# Patient Record
Sex: Male | Born: 1935 | Marital: Married | Smoking: Former smoker
Health system: Southern US, Community
[De-identification: ages and names within clinical notes are randomized; demographics above are authoritative.]

## PROBLEM LIST (undated history)

## (undated) DIAGNOSIS — F319 Bipolar disorder, unspecified: Secondary | ICD-10-CM

## (undated) DIAGNOSIS — E039 Hypothyroidism, unspecified: Secondary | ICD-10-CM

## (undated) DIAGNOSIS — F0390 Unspecified dementia without behavioral disturbance: Secondary | ICD-10-CM

## (undated) DIAGNOSIS — H409 Unspecified glaucoma: Secondary | ICD-10-CM

## (undated) DIAGNOSIS — N400 Enlarged prostate without lower urinary tract symptoms: Secondary | ICD-10-CM

## (undated) DIAGNOSIS — L819 Disorder of pigmentation, unspecified: Secondary | ICD-10-CM

## (undated) DIAGNOSIS — M25561 Pain in right knee: Secondary | ICD-10-CM

## (undated) DIAGNOSIS — M25562 Pain in left knee: Secondary | ICD-10-CM

## (undated) DIAGNOSIS — K219 Gastro-esophageal reflux disease without esophagitis: Secondary | ICD-10-CM

## (undated) DIAGNOSIS — I10 Essential (primary) hypertension: Secondary | ICD-10-CM

## (undated) HISTORY — DX: Essential (primary) hypertension: I10

## (undated) HISTORY — DX: Bipolar disorder, unspecified: F31.9

## (undated) HISTORY — PX: OTHER SURGICAL HISTORY: SHX169

## (undated) HISTORY — DX: Pain in right knee: M25.561

## (undated) HISTORY — DX: Pain in left knee: M25.562

## (undated) HISTORY — DX: Unspecified glaucoma: H40.9

## (undated) HISTORY — DX: Disorder of pigmentation, unspecified: L81.9

## (undated) HISTORY — DX: Gastro-esophageal reflux disease without esophagitis: K21.9

## (undated) HISTORY — DX: Hypothyroidism, unspecified: E03.9

## (undated) HISTORY — DX: Benign prostatic hyperplasia without lower urinary tract symptoms: N40.0

---

## 2000-11-29 ENCOUNTER — Encounter: Admission: RE | Admit: 2000-11-29 | Discharge: 2000-11-29 | Payer: Self-pay | Admitting: Emergency Medicine

## 2000-11-29 ENCOUNTER — Encounter: Payer: Self-pay | Admitting: Emergency Medicine

## 2006-11-28 ENCOUNTER — Ambulatory Visit (HOSPITAL_COMMUNITY): Admission: RE | Admit: 2006-11-28 | Discharge: 2006-11-28 | Payer: Self-pay | Admitting: Psychiatry

## 2007-04-20 ENCOUNTER — Encounter: Admission: RE | Admit: 2007-04-20 | Discharge: 2007-04-20 | Payer: Self-pay | Admitting: Emergency Medicine

## 2007-05-31 ENCOUNTER — Encounter (INDEPENDENT_AMBULATORY_CARE_PROVIDER_SITE_OTHER): Payer: Self-pay | Admitting: Gastroenterology

## 2007-05-31 ENCOUNTER — Ambulatory Visit (HOSPITAL_COMMUNITY): Admission: RE | Admit: 2007-05-31 | Discharge: 2007-05-31 | Payer: Self-pay | Admitting: Gastroenterology

## 2011-03-02 NOTE — Op Note (Signed)
William Dougherty, William Dougherty             ACCOUNT NO.:  [id number]   MEDICAL RECORD NO.:  [id number]          PATIENT TYPE:  AMB   LOCATION:  ENDO                         FACILITY:  [facility]   PHYSICIAN:  Anselmo Rod, M.D.  DATE OF BIRTH:  [birth year]   DATE OF PROCEDURE:  05/31/2007  DATE OF DISCHARGE:                               OPERATIVE REPORT   PROCEDURE PERFORMED:  Esophagogastroduodenoscopy with multiple gastric  biopsies.   ENDOSCOPIST:  Anselmo Rod, M.D.   INSTRUMENT USED:  Pentax video panendoscope.   INDICATIONS FOR PROCEDURE:  77 year old [place] male with a history of  abnormal weight loss, decreased appetite and undergoing an EGD to rule  out peptic ulcer disease, masses, polyps, etc.   PREPROCEDURE PREPARATION:  Informed consent was procured from the  patient.  The patient fasted for 8 hours prior to procedure.  Risks and  benefits of the procedure were discussed with the patient in great  detail.   PREPROCEDURE PHYSICAL:  The patient had stable vital signs.  Neck  supple.  Chest clear to auscultation.  S1, S2 regular.  Abdomen soft  with normal bowel sounds.   DESCRIPTION OF PROCEDURE:  The patient was placed in the left lateral  decubitus position and sedated with 50 mcg of fentanyl and 3 mg of  Versed given intravenously in slow incremental doses. Once the patient  was adequately sedated and maintained on low-flow oxygen and continuous  cardiac monitoring, the Pentax video panendoscope was advanced through  the mouthpiece over the tongue and into the esophagus under direct  vision.  The entire esophagus was widely patent with no evidence of  ring, stricture, mass, esophagitis or Barrett's mucosa. The scope was  then advanced into the stomach. There was a 3-4 cm hiatal hernia  appreciated on high retroflexion. Patchy gastritis was noted especially  in the mid body and antrum. Biopsies were done to rule out presence of H  pylori by pathology.  The proximal small  bowel appeared normal. There  was no outlet obstruction.  The patient tolerated the procedure well  without immediate complications.   IMPRESSION:  1. Normal-appearing esophagus with a healthy Z line.  2. 2-4 cm hiatal hernia seen on high retroflexion.  3. Patchy gastritis in mid body and antrum with old heme overlying      these areas, biopsies done for H pylori.  4. Normal proximal small bowel with no evidence of outlet obstruction.   RECOMMENDATIONS:  1. Await pathology results.  2. Avoid all nonsteroidals including aspirin for the next 4 weeks.  3. Proceed with a colonoscopy at this time.  4. Further recommendations to be made in follow-up.      Anselmo Rod, M.D.  Electronically Signed     JNM/MEDQ  D:  05/31/2007  T:  06/01/2007  Job:  [id number]   cc:   Reuben Likes, M.D.  Fax: [phone]

## 2011-03-02 NOTE — Op Note (Signed)
William Dougherty, William Dougherty             ACCOUNT NO.:  [id number]   MEDICAL RECORD NO.:  [id number]          PATIENT TYPE:  AMB   LOCATION:  ENDO                         FACILITY:  [facility]   PHYSICIAN:  Anselmo Rod, M.D.  DATE OF BIRTH:  1926-04-11   DATE OF PROCEDURE:  05/31/2007  DATE OF DISCHARGE:                               OPERATIVE REPORT   PROCEDURE PERFORMED:  Colonoscopy with cold biopsies x6 and snare  polypectomy x 1.   ENDOSCOPIST:  Anselmo Rod, M.D.   INSTRUMENT USED:  Pentax video colonoscope.   INDICATIONS FOR PROCEDURE:  77 year old [place] male undergoing  colonoscopy for colorectal cancer screening purposes. The patient had a  history of weight loss in the recent past with decreased appetite rule  out colonic polyps, masses, etc.   PREPROCEDURE PREPARATION:  Informed consent was procured from the  patient. The patient fasted for 8 hours prior to the procedure and  prepped with a bottle of magnesium citrate and gallon of NuLytely the  night prior to procedure.  Risks and benefits of the procedure including  a 10% miss rate of cancer and polyp were discussed with the patient as  well.   PREPROCEDURE PHYSICAL:  The patient had stable vital signs. Neck supple.  Chest clear to auscultation. S1, S2 regular. Abdomen soft with normal  bowel sounds.   DESCRIPTION OF PROCEDURE:  The patient was placed in left lateral  decubitus position and sedated with an additional 10 mcg of Fentanyl and  2 mg of Versed given intravenously in slow incremental doses. Once the  patient was adequately sedated and maintained on low-flow oxygen and  continuous cardiac monitoring the Pentax video colonoscope was advanced  from the rectum to the cecum.  Four polyps were biopsied from the distal  left colon.  Small sessile polyp was removed by cold snare from the  rectum.  The transverse colon, right colon, cecum and terminal ileum  appeared normal.  The appendiceal orifice and ileocecal  valve were  clearly visualized and photographed.  Retroflexion in the rectum  revealed no abnormalities. The patient tolerated the procedure well  without immediate complications.  There was no evidence of  diverticulosis.   IMPRESSION:  1. Four small sessile polyps biopsied from the left colon.  2. Another small sessile polyp removed by cold snare from the rectum.  3. Normal-appearing transverse colon, right colon, cecum and terminal      ileum.  4. No evidence of diverticulosis.   RECOMMENDATIONS:  1. Await pathology results.  2. Avoid all nonsteroidals including aspirin for the next 4 weeks.  3. Outpatient follow-up in the next 2 weeks for further      recommendations.      Anselmo Rod, M.D.  Electronically Signed     JNM/MEDQ  D:  05/31/2007  T:  06/01/2007  Job:  [id number]   cc:   Reuben Likes, M.D.  Fax: [phone]

## 2012-07-05 ENCOUNTER — Other Ambulatory Visit: Payer: Self-pay | Admitting: Family Medicine

## 2012-07-05 DIAGNOSIS — E041 Nontoxic single thyroid nodule: Secondary | ICD-10-CM

## 2012-07-07 ENCOUNTER — Ambulatory Visit
Admission: RE | Admit: 2012-07-07 | Discharge: 2012-07-07 | Disposition: A | Discharge status: Home / Self Care | Payer: Medicare Other | Source: Ambulatory Visit | Attending: Family Medicine | Admitting: Family Medicine

## 2012-07-07 DIAGNOSIS — E041 Nontoxic single thyroid nodule: Secondary | ICD-10-CM

## 2012-07-25 ENCOUNTER — Other Ambulatory Visit: Payer: Self-pay | Admitting: Endocrinology

## 2012-07-25 DIAGNOSIS — E041 Nontoxic single thyroid nodule: Secondary | ICD-10-CM

## 2012-07-26 ENCOUNTER — Other Ambulatory Visit: Payer: Self-pay | Admitting: Radiology

## 2012-08-03 ENCOUNTER — Ambulatory Visit
Admission: RE | Admit: 2012-08-03 | Discharge: 2012-08-03 | Disposition: A | Discharge status: Home / Self Care | Payer: Medicare Other | Source: Ambulatory Visit | Attending: Endocrinology | Admitting: Endocrinology

## 2012-08-03 ENCOUNTER — Other Ambulatory Visit (HOSPITAL_COMMUNITY)
Admission: RE | Admit: 2012-08-03 | Discharge: 2012-08-03 | Disposition: A | Discharge status: Home / Self Care | Payer: Medicare Other | Source: Ambulatory Visit | Attending: Interventional Radiology | Admitting: Interventional Radiology

## 2012-08-03 DIAGNOSIS — E041 Nontoxic single thyroid nodule: Secondary | ICD-10-CM

## 2012-08-03 DIAGNOSIS — E049 Nontoxic goiter, unspecified: Secondary | ICD-10-CM | POA: Insufficient documentation

## 2013-08-10 ENCOUNTER — Encounter: Payer: Self-pay | Admitting: Neurology

## 2013-08-13 ENCOUNTER — Ambulatory Visit
Admission: RE | Admit: 2013-08-13 | Discharge: 2013-08-13 | Disposition: A | Discharge status: Home / Self Care | Payer: Medicare Other | Source: Ambulatory Visit | Attending: Neurology | Admitting: Neurology

## 2013-08-13 ENCOUNTER — Telehealth: Payer: Self-pay | Admitting: Neurology

## 2013-08-13 ENCOUNTER — Encounter: Payer: Self-pay | Admitting: Neurology

## 2013-08-13 ENCOUNTER — Ambulatory Visit (INDEPENDENT_AMBULATORY_CARE_PROVIDER_SITE_OTHER): Payer: Medicare Other | Admitting: Neurology

## 2013-08-13 VITALS — BP 111/64 | HR 72 | Ht 65.0 in | Wt 135.0 lb

## 2013-08-13 DIAGNOSIS — R32 Unspecified urinary incontinence: Secondary | ICD-10-CM

## 2013-08-13 DIAGNOSIS — R413 Other amnesia: Secondary | ICD-10-CM | POA: Insufficient documentation

## 2013-08-13 DIAGNOSIS — M25569 Pain in unspecified knee: Secondary | ICD-10-CM

## 2013-08-13 DIAGNOSIS — R269 Unspecified abnormalities of gait and mobility: Secondary | ICD-10-CM

## 2013-08-13 DIAGNOSIS — E538 Deficiency of other specified B group vitamins: Secondary | ICD-10-CM | POA: Insufficient documentation

## 2013-08-13 NOTE — Progress Notes (Signed)
[facility]  PATIENT: William Dougherty DOB: 03/20/1926  HISTORICAL  Mr. Celaya is a 79 years old [place] male, is accompanied by his son, daughter-in-law, referred by his primary care physician Dr. Elias Else for evaluation of gradual onset memory trouble.  He is a retired Art gallery manager, has past medical history of hypothyroidism, on supplement, hypertension. He lives with his son's family, over the past 2 years, he had gradual onset memory loss, he was noted to repeat himself, become very forgetful, one hour after eating breakfast, he would forget that he has eaten breakfast, he spent most of his day watching TV, not interested in any activities,  He has gait trouble, walking with a cane, he complains of bilateral knee pain, denies significant low back pain, over the past 6 months, he has worsening memory trouble, also developed urinary incontinence, He wet himself himself on his way to the bathroom. He denies bowel incontinence  He can dress himself.   REVIEW OF SYSTEMS: Full 14 system review of systems performed and notable only for  confusion, weakness, sleepiness, too much sleep decreased energy, disinterested in activities, urination problems  ALLERGIES: Allergies  Allergen Reactions  . Eggs Or Egg-Derived Products Diarrhea and Nausea Only    HOME MEDICATIONS: Outpatient Prescriptions Prior to Visit  Medication Sig Dispense Refill  . cholecalciferol (VITAMIN D) 1000 UNITS tablet Take 2,000 Units by mouth daily.      Marland Kitchen dutasteride (AVODART) 0.5 MG capsule Take 0.5 mg by mouth daily.      . influenza virus trivalent vaccine (FLUZONE MDV) injection Inject 0.5 mLs into the muscle once. Per records received flu shot on 06/05/2013.      . levothyroxine (SYNTHROID, LEVOTHROID) 50 MCG tablet Take 50 mcg by mouth daily before breakfast.      . losartan-hydrochlorothiazide (HYZAAR) 50-12.5 MG per tablet Take 1 tablet by mouth every morning.      . travoprost,  benzalkonium, (TRAVATAN) 0.004 % ophthalmic solution 1 drop at bedtime.       No facility-administered medications prior to visit.    PAST MEDICAL HISTORY: Past Medical History  Diagnosis Date  . Hypertension   . Hypothyroidism   . BPH (benign prostatic hyperplasia)   . Glaucoma   . GERD (gastroesophageal reflux disease)   . Knee pain, bilateral   . Bipolar 1 disorder   . Hyperpigmentation     left maxilla    PAST SURGICAL HISTORY: Past Surgical History  Procedure Laterality Date  . None      FAMILY HISTORY: Family History  Problem Relation Age of Onset  . Hypothyroidism Son   . Vitiligo Son   . Hashimoto's thyroiditis Grandchild     SOCIAL HISTORY:    PHYSICAL EXAM   Filed Vitals:   08/13/13 0945  BP: 111/64  Pulse: 72  Height: 5\' 5"  (1.651 m)  Weight: 135 lb (61.236 kg)   Body mass index is 22.47 kg/(m^2).   Generalized: In no acute distress  Neck: Supple, no carotid bruits   Cardiac: Regular rate rhythm  Pulmonary: Clear to auscultation bilaterally  Musculoskeletal: No deformity  Neurological examination  Mentation: Alert oriented to time, place, history taking, and causual conversation, MMSE 22/30, he is not oriented to time, missed 3/3 recalls.  Cranial nerve II-XII: Pupils were equal round reactive to light extraocular movements were full, visual field were full on confrontational test. facial sensation and strength were normal. hearing was intact to finger rubbing bilaterally. Uvula tongue midline.  head turning and  shoulder shrug and were normal and symmetric.Tongue protrusion into cheek strength was normal.  Motor: normal tone, bulk and strength.  Sensory: Intact to fine touch, pinprick, preserved vibratory sensation, and proprioception at toes.  Coordination: Normal finger to nose, heel-to-shin bilaterally there was no truncal ataxia  Gait: push up from seated position, cautious, mildly limp, difficulty swing his right pelvic  Deep  tendon reflexes: Brachioradialis 2/2, biceps 2/2, triceps 2/2, patellar 2/2, Achilles 1/1, plantar responses were flexor bilaterally.   DIAGNOSTIC DATA (LABS, IMAGING, TESTING) - I reviewed patient records, labs, notes, testing and imaging myself where available.  ASSESSMENT AND PLAN   79 year old [place] male, with gradual onset memory trouble, most consistent with mild dementia, Mini-Mental Status Examination is 22 out of 30 today,  1. proceed with MRI of brain 2. Laboratory evaluation to rule out treatable cause, including vitamin B12, 3. x-ray of his hip, and knee, 4. Return to clinic in 3 months       Levert Feinstein, M.D. Ph.D.  [facility] [address], [address] [place], [place] [address] [phone]

## 2013-08-13 NOTE — Telephone Encounter (Signed)
Called, to give results, left message  With daughter in law for return call back for results

## 2013-08-13 NOTE — Telephone Encounter (Signed)
Please call patient, x-ray of hips were normal.  X-ray of knee showed mild effusion.

## 2013-08-13 NOTE — Telephone Encounter (Signed)
Shared x-ray results per Dr Zannie Cove notes with son ,he understood

## 2013-08-14 LAB — C-REACTIVE PROTEIN: CRP: 0.1 mg/L (ref 0.0–4.9)

## 2013-08-14 LAB — SEDIMENTATION RATE: Sed Rate: 3 mm/hr (ref 0–30)

## 2013-08-16 ENCOUNTER — Ambulatory Visit: Payer: Medicare Other | Attending: Neurology | Admitting: Physical Therapy

## 2013-08-16 DIAGNOSIS — M6281 Muscle weakness (generalized): Secondary | ICD-10-CM | POA: Insufficient documentation

## 2013-08-16 DIAGNOSIS — IMO0001 Reserved for inherently not codable concepts without codable children: Secondary | ICD-10-CM | POA: Insufficient documentation

## 2013-08-16 DIAGNOSIS — R269 Unspecified abnormalities of gait and mobility: Secondary | ICD-10-CM | POA: Insufficient documentation

## 2013-08-16 NOTE — Progress Notes (Signed)
Quick Note:  Shared normal labs thru VM message ______ 

## 2013-08-17 LAB — VITAMIN B12: Vitamin B-12: 325 pg/mL (ref 211–946)

## 2013-08-17 LAB — METHYLMALONIC ACID, SERUM: Methylmalonic Acid: 213 nmol/L (ref 0–378)

## 2013-08-17 NOTE — Progress Notes (Signed)
Quick Note:  Shared normal lab results thru VM message ______ 

## 2013-08-17 NOTE — Progress Notes (Signed)
Quick Note:  Please call patient for normal lab. ______

## 2013-08-20 ENCOUNTER — Ambulatory Visit
Admission: RE | Admit: 2013-08-20 | Discharge: 2013-08-20 | Disposition: A | Discharge status: Home / Self Care | Payer: Medicare Other | Source: Ambulatory Visit | Attending: Neurology | Admitting: Neurology

## 2013-08-20 DIAGNOSIS — R269 Unspecified abnormalities of gait and mobility: Secondary | ICD-10-CM

## 2013-08-20 DIAGNOSIS — R413 Other amnesia: Secondary | ICD-10-CM

## 2013-08-20 DIAGNOSIS — R32 Unspecified urinary incontinence: Secondary | ICD-10-CM

## 2013-08-20 DIAGNOSIS — M25569 Pain in unspecified knee: Secondary | ICD-10-CM

## 2013-08-21 ENCOUNTER — Telehealth: Payer: Self-pay | Admitting: Neurology

## 2013-08-21 ENCOUNTER — Ambulatory Visit: Payer: Medicare Other | Attending: Neurology | Admitting: Physical Therapy

## 2013-08-21 DIAGNOSIS — M6281 Muscle weakness (generalized): Secondary | ICD-10-CM | POA: Insufficient documentation

## 2013-08-21 DIAGNOSIS — IMO0001 Reserved for inherently not codable concepts without codable children: Secondary | ICD-10-CM | POA: Insufficient documentation

## 2013-08-21 DIAGNOSIS — R269 Unspecified abnormalities of gait and mobility: Secondary | ICD-10-CM | POA: Insufficient documentation

## 2013-08-21 NOTE — Progress Notes (Signed)
Quick Note:  Please call patient, age related atrophy on brain MRI, we can review film together on his follow up visit. ______

## 2013-08-23 ENCOUNTER — Other Ambulatory Visit: Payer: Self-pay

## 2013-08-23 NOTE — Telephone Encounter (Signed)
Please give him a follow up visit with me on next available.

## 2013-08-23 NOTE — Progress Notes (Signed)
Quick Note:  Please call patient's son at [phone], he has lots of questions and wants to speak to doctor today. I relayed MRI brain results, per Dr. Terrace Arabia. ______

## 2013-08-23 NOTE — Telephone Encounter (Signed)
Spoke to patients son and the family will come see you 08-28-2013 Tuesday.

## 2013-08-24 ENCOUNTER — Ambulatory Visit: Payer: Medicare Other | Admitting: Physical Therapy

## 2013-08-28 ENCOUNTER — Encounter: Payer: Self-pay | Admitting: Neurology

## 2013-08-28 ENCOUNTER — Ambulatory Visit (INDEPENDENT_AMBULATORY_CARE_PROVIDER_SITE_OTHER): Payer: Medicare Other | Admitting: Neurology

## 2013-08-28 ENCOUNTER — Ambulatory Visit: Payer: Medicare Other | Admitting: Physical Therapy

## 2013-08-28 VITALS — BP 120/66 | HR 65 | Ht 65.0 in | Wt 134.0 lb

## 2013-08-28 DIAGNOSIS — F0390 Unspecified dementia without behavioral disturbance: Secondary | ICD-10-CM | POA: Insufficient documentation

## 2013-08-28 MED ORDER — DONEPEZIL HCL 10 MG PO TABS: 10.0000 mg | ORAL_TABLET | Freq: Every day | ORAL | Status: DC

## 2013-08-28 NOTE — Progress Notes (Signed)
[facility]  PATIENT: William Dougherty DOB: Oct 11, 1926  HISTORICAL  William Dougherty is a 79 years old [place] male, is accompanied by his son, daughter-in-law, referred by his primary care physician Dr. Elias Else for evaluation of gradual onset memory trouble.  He is a retired Art gallery manager, has past medical history of hypothyroidism, on supplement, hypertension. He lives with his son's family, over the past 2 years, he had gradual onset memory loss, he was noted to repeat himself, become very forgetful, one hour after eating breakfast, he would forget that he has eaten breakfast, he spent most of his day watching TV, not interested in any activities,  He has gait trouble, walking with a cane, he complains of bilateral knee pain, denies significant low back pain, over the past 6 months, he has worsening memory trouble, also developed urinary incontinence, He wet himself himself on his way to the bathroom. He denies bowel incontinence  In 2008, he had excessive activity, calling people at night, shopping mood, he was treated with seroqueal, he became sleepy all the time,  He can dress himself.  UPDATE 09/03/2013: He is accompanied by his son and daughter in law today,  He denies significant pain, MRI of the brain reviewed together, there is evidence of moderate to severe atrophy, mild small vessel disease, laboratory showed a normal ESR, C-reactive protein, vitamin B12, methylmalonic acid level.  X-ray of bilateral hip showed no significant abnormality, x-ray of bilateral knee showed degenerative arthritis, with evidence of effusion.   He continued to have gait difficulty.  he denies low back pain, he needs prompt to take a bath, using bathroom, otherwise he would make accident,   REVIEW OF SYSTEMS: Full 14 system review of systems performed and notable only for  confusion, weakness, sleepiness, too much sleep decreased energy, disinterested in activities, urination  problems  ALLERGIES: Allergies  Allergen Reactions  . Eggs Or Egg-Derived Products Diarrhea and Nausea Only    HOME MEDICATIONS: Outpatient Prescriptions Prior to Visit  Medication Sig Dispense Refill  . cholecalciferol (VITAMIN D) 1000 UNITS tablet Take 2,000 Units by mouth daily.      . dorzolamide (TRUSOPT) 2 % ophthalmic solution       . dutasteride (AVODART) 0.5 MG capsule Take 0.5 mg by mouth daily.      . influenza virus trivalent vaccine (FLUZONE MDV) injection Inject 0.5 mLs into the muscle once. Per records received flu shot on 06/05/2013.      . levothyroxine (SYNTHROID, LEVOTHROID) 50 MCG tablet Take 50 mcg by mouth daily before breakfast.      . losartan-hydrochlorothiazide (HYZAAR) 50-12.5 MG per tablet Take 1 tablet by mouth every morning.      . travoprost, benzalkonium, (TRAVATAN) 0.004 % ophthalmic solution 1 drop at bedtime.       No facility-administered medications prior to visit.    PAST MEDICAL HISTORY: Past Medical History  Diagnosis Date  . Hypertension   . Hypothyroidism   . BPH (benign prostatic hyperplasia)   . Glaucoma   . GERD (gastroesophageal reflux disease)   . Knee pain, bilateral   . Bipolar 1 disorder   . Hyperpigmentation     left maxilla    PAST SURGICAL HISTORY: Past Surgical History  Procedure Laterality Date  . None      FAMILY HISTORY: Family History  Problem Relation Age of Onset  . Hypothyroidism Son   . Vitiligo Son   . Hashimoto's thyroiditis Grandchild     SOCIAL HISTORY:  PHYSICAL EXAM   Filed Vitals:   08/28/13 0841  BP: 120/66  Pulse: 65  Height: 5\' 5"  (1.651 m)  Weight: 134 lb (60.782 kg)   Body mass index is 22.3 kg/(m^2).   Generalized: In no acute distress  Neck: Supple, no carotid bruits   Cardiac: Regular rate rhythm  Pulmonary: Clear to auscultation bilaterally  Musculoskeletal: No deformity  Neurological examination  Mentation: Alert oriented to time, place, history taking, and  causual conversation, MMSE 18/30, he is not oriented to time, missed 3/3 recalls, he has difficulty copy design   Cranial nerve II-XII: Pupils were equal round reactive to light extraocular movements were full, visual field were full on confrontational test. facial sensation and strength were normal. hearing was intact to finger rubbing bilaterally. Uvula tongue midline.  head turning and shoulder shrug and were normal and symmetric.Tongue protrusion into cheek strength was normal.  Motor: normal tone, bulk and strength.  Sensory: Intact to fine touch, pinprick, preserved vibratory sensation, and proprioception at toes.  Coordination: Normal finger to nose, heel-to-shin bilaterally there was no truncal ataxia  Gait: push up from seated position, cautious, mildly limp, difficulty swing his right pelvic  Deep tendon reflexes: Brachioradialis 2/2, biceps 2/2, triceps 2/2, patellar 2/2, Achilles 1/1, plantar responses were flexor bilaterally.   DIAGNOSTIC DATA (LABS, IMAGING, TESTING) - I reviewed patient records, labs, notes, testing and imaging myself where available.  ASSESSMENT AND PLAN   79 year old [place] male, with gradual onset memory trouble, most consistent with Alzheimer's  dementia, Mini-Mental Status Examination is  18  out of 30 today,  1.  continue moderate exercise, 2 add on Aricept 10 mg every day 3. Return to clinic in 6 months.   Marland Kitchen

## 2013-08-29 ENCOUNTER — Ambulatory Visit: Payer: Medicare Other | Admitting: Physical Therapy

## 2013-08-31 ENCOUNTER — Ambulatory Visit: Payer: Medicare Other | Admitting: Physical Therapy

## 2013-09-04 ENCOUNTER — Ambulatory Visit: Payer: Medicare Other | Admitting: Physical Therapy

## 2013-09-05 ENCOUNTER — Encounter: Payer: Self-pay | Admitting: Podiatry

## 2013-09-05 ENCOUNTER — Ambulatory Visit (INDEPENDENT_AMBULATORY_CARE_PROVIDER_SITE_OTHER): Payer: Medicare Other | Admitting: Podiatry

## 2013-09-05 VITALS — BP 133/67 | HR 70 | Resp 16 | Ht 65.0 in | Wt 130.0 lb

## 2013-09-05 DIAGNOSIS — B351 Tinea unguium: Secondary | ICD-10-CM

## 2013-09-05 DIAGNOSIS — M79609 Pain in unspecified limb: Secondary | ICD-10-CM

## 2013-09-05 NOTE — Progress Notes (Signed)
Patient ID: William Dougherty, male   DOB: 1926/04/08, 79 y.o.   MRN: [id number]  Subjective: 79 year old confused [place] male presents with son requesting debridement of painful mycotic toenails. He has been a patient in the [facility] 2010 and presents intermittently for nail debridement. Last visit was 08/04/2013.  Objective : Hypertrophic, incurvated, discolored toenails x10 with palpable tenderness in all nail plates.  Assessment: Symptomatic onychomycoses x10  Plan: All 10 toenails debrided without any bleeding. Return intervals recommended in 3 months.

## 2013-09-07 ENCOUNTER — Ambulatory Visit: Payer: Medicare Other | Admitting: Physical Therapy

## 2013-09-10 ENCOUNTER — Ambulatory Visit: Payer: Medicare Other | Admitting: Physical Therapy

## 2013-09-12 ENCOUNTER — Ambulatory Visit: Payer: Medicare Other | Admitting: Physical Therapy

## 2013-09-20 ENCOUNTER — Telehealth: Payer: Self-pay | Admitting: *Deleted

## 2013-09-20 NOTE — Telephone Encounter (Signed)
Pt caregiver states pt has a discoloration around the toenail.  I left a message instructing to make an appt, but could begin Epsom salt soaks !/4 C Epsom salt in a Qt warm water soak 20 minutes daily and cover with an antibiotic bandaid until appt.

## 2013-09-21 NOTE — Telephone Encounter (Signed)
LMOVM TO CALL BACK AND SCHED AN APPT

## 2013-09-24 ENCOUNTER — Ambulatory Visit (INDEPENDENT_AMBULATORY_CARE_PROVIDER_SITE_OTHER): Payer: Medicare Other | Admitting: Podiatry

## 2013-09-24 ENCOUNTER — Encounter: Payer: Self-pay | Admitting: Podiatry

## 2013-09-24 VITALS — BP 114/60 | HR 72 | Resp 16 | Ht 66.0 in | Wt 140.0 lb

## 2013-09-24 DIAGNOSIS — L259 Unspecified contact dermatitis, unspecified cause: Secondary | ICD-10-CM

## 2013-09-24 NOTE — Patient Instructions (Addendum)
Apply topical antibiotic ointment to the crack on the skin on the right great toe daily, cover with Band-Aid until healed.

## 2013-09-24 NOTE — Progress Notes (Signed)
   Subjective:    Patient ID: Jojuan Champney, male    DOB: 05/19/1926, 79 y.o.   MRN: [id number]  "He has a problem with the skin around his nail," stated patient's son.  Toe Pain  Incident onset: 1 week, fissured  There was no injury mechanism. Pain location: right hallux. The pain is at a severity of 0/10. The patient is experiencing no pain. He reports no foreign bodies present. Nothing aggravates the symptoms. He has tried nothing for the symptoms.   Patient presents with his son   Review of Systems     Objective:   Physical Exam  79 year old white male who appears confused presents with his son.  Vascular: DP and PT pulses are two over four bilaterally  Neurological: Sensation intact  Dermatological: Fissured distal right hallux noted without any erythema edema surrounding the wound site.       Assessment & Plan:   Assessment: Noninfected fissure distal right hallux  Plan: Patient's son advised to apply topical antibiotic ointment and Band-Aid daily until healed.

## 2013-11-13 ENCOUNTER — Ambulatory Visit (INDEPENDENT_AMBULATORY_CARE_PROVIDER_SITE_OTHER): Payer: Medicare Other | Admitting: Neurology

## 2013-11-13 ENCOUNTER — Encounter: Payer: Self-pay | Admitting: Neurology

## 2013-11-13 VITALS — BP 118/75 | HR 66 | Ht 65.0 in | Wt 132.0 lb

## 2013-11-13 DIAGNOSIS — R269 Unspecified abnormalities of gait and mobility: Secondary | ICD-10-CM

## 2013-11-13 DIAGNOSIS — R413 Other amnesia: Secondary | ICD-10-CM

## 2013-11-13 DIAGNOSIS — F0390 Unspecified dementia without behavioral disturbance: Secondary | ICD-10-CM

## 2013-11-13 MED ORDER — SERTRALINE HCL 25 MG PO TABS: ORAL_TABLET | ORAL | Status: DC

## 2013-11-13 NOTE — Progress Notes (Signed)
[facility]  PATIENT: William Dougherty DOB: 1926/05/05  HISTORICAL  William Dougherty is a 80 years old [place] male, is accompanied by his son, daughter-in-law, referred by his primary care physician William Dougherty for evaluation of gradual onset memory trouble.  He is a retired Chief Financial Officer, has past medical history of hypothyroidism, on supplement, hypertension. He lives with his son's family, over the past 2 years, he had gradual onset memory loss, he was noted to repeat himself, become very forgetful, one hour after eating breakfast, he would forget that he has eaten breakfast, he spent most of his day watching TV, not interested in any activities,  He has gait trouble, walking with a cane, he complains of bilateral knee pain, denies significant low back pain, over the past 6 months, he has worsening memory trouble, also developed urinary incontinence, He wet himself himself on his way to the bathroom. He denies bowel incontinence  In 2008, he had excessive activity, calling people at night, shopping mood, he was treated with seroqueal, he became sleepy all the time,  He can dress himself.  UPDATE 09/03/2013: He is accompanied by his son and daughter in law today,  He denies significant pain, MRI of the brain reviewed together, there is evidence of moderate to severe atrophy, mild small vessel disease, laboratory showed a normal ESR, C-reactive protein, vitamin B12, methylmalonic acid level.  X-ray of bilateral hip showed no significant abnormality, x-ray of bilateral knee showed degenerative arthritis, with evidence of effusion.   He continued to have gait difficulty.  he denies low back pain, he needs prompt to take a bath, using bathroom, otherwise he would make accident,   UPDATE Jan 27th 2015: He is with his son, and daughter at today's clinical visit, He was started on Aricept since last visit in November 2014, but he is no longer taking it any more. There was no significant  improvement. he seems to be depressed,  It is a rapid decline in two years, the most bothersome symptoms is using bathroom, wet his pants, He denies pain, has mild gait difficulties  REVIEW OF SYSTEMS: Full 14 system review of systems performed and notable only forglaucoma, Joint pain, confusion, memory loss, depression   ALLERGIES: Allergies  Allergen Reactions  . Eggs Or Egg-Derived Products Diarrhea and Nausea Only    HOME MEDICATIONS: Outpatient Prescriptions Prior to Visit  Medication Sig Dispense Refill  . BOOSTRIX 5-2.5-18.5 injection       . cholecalciferol (VITAMIN D) 1000 UNITS tablet Take 2,000 Units by mouth daily.      Marland Kitchen donepezil (ARICEPT) 10 MG tablet Take 1 tablet (10 mg total) by mouth at bedtime.  30 tablet  12  . dorzolamide (TRUSOPT) 2 % ophthalmic solution       . dutasteride (AVODART) 0.5 MG capsule Take 0.5 mg by mouth daily.      . influenza virus trivalent vaccine (FLUZONE MDV) injection Inject 0.5 mLs into the muscle once. Per records received flu shot on 06/05/2013.      . levothyroxine (SYNTHROID, LEVOTHROID) 50 MCG tablet Take 50 mcg by mouth daily before breakfast.      . losartan-hydrochlorothiazide (HYZAAR) 50-12.5 MG per tablet Take 1 tablet by mouth every morning.      . travoprost, benzalkonium, (TRAVATAN) 0.004 % ophthalmic solution 1 drop at bedtime.      Marland Kitchen ZOSTAVAX 82060 UNT/0.65ML injection        No facility-administered medications prior to visit.    PAST MEDICAL HISTORY: Past  Medical History  Diagnosis Date  . Hypertension   . Hypothyroidism   . BPH (benign prostatic hyperplasia)   . Glaucoma   . GERD (gastroesophageal reflux disease)   . Knee pain, bilateral   . Bipolar 1 disorder   . Hyperpigmentation     left maxilla    PAST SURGICAL HISTORY: Past Surgical History  Procedure Laterality Date  . None      FAMILY HISTORY: Family History  Problem Relation Age of Onset  . Hypothyroidism Son   . Vitiligo Son   . Hashimoto's  thyroiditis Grandchild     SOCIAL HISTORY:    PHYSICAL EXAM   Filed Vitals:   11/13/13 1355  BP: 118/75  Pulse: 66  Height: 5' 5"  (1.651 m)  Weight: 132 lb (59.875 kg)   Body mass index is 21.97 kg/(m^2).   Generalized: In no acute distress  Neck: Supple, no carotid bruits   Cardiac: Regular rate rhythm  Pulmonary: Clear to auscultation bilaterally  Musculoskeletal: No deformity  Neurological examination  Mentation: Alert oriented to time, place, history taking, and causual conversation, MMSE  26 /30, he is not oriented to time, place,  missed  to /3 recalls  Cranial nerve II-XII: Pupils were equal round reactive to light extraocular movements were full, visual field were full on confrontational test. facial sensation and strength were normal. hearing was intact to finger rubbing bilaterally. Uvula tongue midline.  head turning and shoulder shrug and were normal and symmetric.Tongue protrusion into cheek strength was normal.  Motor: normal tone, bulk and strength.  Sensory: Intact to fine touch, pinprick, preserved vibratory sensation, and proprioception at toes.  Coordination: Normal finger to nose, heel-to-shin bilaterally there was no truncal ataxia  Gait: push up from seated position, cautious, mildly limp, difficulty swing his right pelvic  Deep tendon reflexes: Brachioradialis 2/2, biceps 2/2, triceps 2/2, patellar 2/2, Achilles 1/1, plantar responses were flexor bilaterally.   DIAGNOSTIC DATA (LABS, IMAGING, TESTING) - I reviewed patient records, labs, notes, testing and imaging myself where available.  ASSESSMENT AND PLAN   80 year old [place] male, with gradual onset memory trouble, most consistent with Alzheimer's  dementia, Mini-Mental Status Examination is  18  out of 30 today,We have reviewed MRI films together, there was significant generalized atrophy, moderate periventricular small vessel disease,    1.  continue moderate exercise, 2.  keep   Aricept 10 mg every day 3.  I will refer him to neuropsychology William Dougherty for further evaluation also add on Zoloft 25 mg titrating to 50 mg 4. Return to clinic in 6 months with William Dougherty

## 2013-12-12 ENCOUNTER — Ambulatory Visit (INDEPENDENT_AMBULATORY_CARE_PROVIDER_SITE_OTHER): Payer: Medicare Other | Admitting: Podiatry

## 2013-12-12 ENCOUNTER — Encounter: Payer: Self-pay | Admitting: Podiatry

## 2013-12-12 VITALS — BP 138/68 | HR 70 | Resp 12

## 2013-12-12 DIAGNOSIS — B351 Tinea unguium: Secondary | ICD-10-CM

## 2013-12-12 DIAGNOSIS — M79609 Pain in unspecified limb: Secondary | ICD-10-CM

## 2013-12-12 NOTE — Patient Instructions (Signed)
Apply skin lotion such as cocoa butter to the right great toe daily for the dry skin.

## 2013-12-13 NOTE — Progress Notes (Signed)
Patient ID: William Dougherty, male   DOB: 05/02/1926, 80 y.o.   MRN: [id number]  Subjective: This confused 80 year old male presents with his who son is requesting debridement of his father's painful toenails. He was last seen for the similar service on 09/05/2013.  Objective: Hypertrophic, incurvated, discolored toenails x10  Assessment: Symptomatic onychomycoses x10  Plan: All 10 toenails are debrided without any bleeding.  Return at three-month intervals

## 2014-01-04 DIAGNOSIS — F0390 Unspecified dementia without behavioral disturbance: Secondary | ICD-10-CM

## 2014-02-28 ENCOUNTER — Ambulatory Visit: Payer: Medicare Other | Admitting: Nurse Practitioner

## 2014-03-05 ENCOUNTER — Ambulatory Visit: Payer: Self-pay | Admitting: Nurse Practitioner

## 2014-03-13 ENCOUNTER — Encounter: Payer: Self-pay | Admitting: Podiatry

## 2014-03-13 ENCOUNTER — Ambulatory Visit (INDEPENDENT_AMBULATORY_CARE_PROVIDER_SITE_OTHER): Payer: Medicare Other | Admitting: Podiatry

## 2014-03-13 VITALS — BP 135/76 | HR 67 | Resp 16 | Ht 67.0 in | Wt 135.0 lb

## 2014-03-13 DIAGNOSIS — B351 Tinea unguium: Secondary | ICD-10-CM

## 2014-03-13 DIAGNOSIS — M79609 Pain in unspecified limb: Secondary | ICD-10-CM

## 2014-03-13 NOTE — Progress Notes (Signed)
Patient ID: William Dougherty, male   DOB: 15-Apr-1926, 80 y.o.   MRN: [id number]  Subjective: This confused 80 year old male presents with his who son is requesting debridement of his father's painful toenails. He was last seen for the similar service on 12/13/2013   Objective: Hypertrophic, incurvated, discolored toenails x10   Assessment: Symptomatic onychomycoses x10   Plan: All 10 toenails are debrided without any bleeding.  Return at three-month intervals

## 2014-06-18 ENCOUNTER — Ambulatory Visit (INDEPENDENT_AMBULATORY_CARE_PROVIDER_SITE_OTHER): Payer: Medicare Other | Admitting: Nurse Practitioner

## 2014-06-18 ENCOUNTER — Encounter: Payer: Self-pay | Admitting: Nurse Practitioner

## 2014-06-18 VITALS — BP 108/63 | HR 66 | Ht 65.0 in | Wt 127.0 lb

## 2014-06-18 DIAGNOSIS — F0390 Unspecified dementia without behavioral disturbance: Secondary | ICD-10-CM

## 2014-06-18 DIAGNOSIS — R413 Other amnesia: Secondary | ICD-10-CM

## 2014-06-18 MED ORDER — DONEPEZIL HCL 5 MG PO TABS: 5.0000 mg | ORAL_TABLET | Freq: Every day | ORAL | Status: DC

## 2014-06-18 NOTE — Progress Notes (Signed)
[facility]  PATIENT: William Dougherty DOB: Feb 19, 1926   REASON FOR VISIT: follow up for dementia    HISTORY OF PRESENT ILLNESS: William Dougherty, 80 year old male returns for followup accompanied by his son. He was last in the office 11/13/2013 William Dougherty. At that time she had asked him to continue Aricept 10 mg daily this drug has never been initiated. He had neuropsych testing with William Dougherty. This revealed" moderate dementia most likely of the Alzheimer's type. There were no signs of psychiatric behavioral dysfunction. He is not depressed but rather lack of initiative that can be associated with dementia. He does not possess sufficient mental capacity  to make decisions on his own". The son reports that he needs assistance with bathing and toileting. He can feed himself once  his food is prepared and he has a good appetite. He is sleeping well without any wandering behavior. There have been no safety concerns identified. William Dougherty lives with his son. He has been granted 4 hours of care 7 days a week by [employer]. He has gait difficulty with several falls since last seen. He uses a quad cane. He returns for reevaluation.  HISTORY: William Dougherty is a 80 years old [place] male, is accompanied by his son, daughter-in-law, referred by his primary care physician William Dougherty for evaluation of gradual onset memory trouble.  He is a retired Chief Financial Officer, has past medical history of hypothyroidism, on supplement, hypertension. He lives with his son's family, over the past 2 years, he had gradual onset memory loss, he was noted to repeat himself, become very forgetful, one hour after eating breakfast, he would forget that he has eaten breakfast, he spent most of his day watching TV, not interested in any activities,  He has gait trouble, walking with a cane, he complains of bilateral knee pain, denies significant low back pain, over the past 6 months, he has worsening memory trouble, also developed  urinary incontinence, He wet himself himself on his way to the bathroom. He denies bowel incontinence  In 2008, he had excessive activity, calling people at night, shopping mood, he was treated with seroqueal, he became sleepy all the time,  He can dress himself.  UPDATE 09/03/2013:  He is accompanied by his son and daughter in law today, He denies significant pain, MRI of the brain reviewed together, there is evidence of moderate to severe atrophy, mild small vessel disease, laboratory showed a normal ESR, C-reactive protein, vitamin B12, methylmalonic acid level.  X-ray of bilateral hip showed no significant abnormality, x-ray of bilateral knee showed degenerative arthritis, with evidence of effusion.  He continued to have gait difficulty. he denies low back pain, he needs prompt to take a bath, using bathroom, otherwise he would make accident,  UPDATE Jan 27th 2015:  He is with his son, and daughter at today's clinical visit, He was started on Aricept since last visit in November 2014, but he is no longer taking it any more. There was no significant improvement. he seems to be depressed, It is a rapid decline in two years, the most bothersome symptoms is using bathroom, wet his pants, He denies pain, has mild gait difficulties   REVIEW OF SYSTEMS: Full 14 system review of systems performed and notable only for those listed, all others are neg:  Constitutional: N/A  Cardiovascular: N/A  Ear/Nose/Throat: N/A  Skin: N/A  Eyes:  glaucoma  Respiratory: N/A  Gastroitestinal: N/A  Hematology/Lymphatic:  anemia  Endocrine: N/A Musculoskeletal:N/A  Allergy/Immunology:  allergic to  eggs  Neurological:  memory loss  Psychiatric:  confusion, lack of initiative  Sleep : NA   ALLERGIES: Allergies  Allergen Reactions  . Eggs Or Egg-Derived Products Diarrhea and Nausea Only    HOME MEDICATIONS: Outpatient Prescriptions Prior to Visit  Medication Sig Dispense Refill  . BOOSTRIX 5-2.5-18.5  injection       . cholecalciferol (VITAMIN D) 1000 UNITS tablet Take 2,000 Units by mouth daily.      Marland Kitchen dutasteride (AVODART) 0.5 MG capsule Take 0.5 mg by mouth daily.      . influenza virus trivalent vaccine (FLUZONE MDV) injection Inject 0.5 mLs into the muscle once. Per records received flu shot on 06/05/2013.      . levothyroxine (SYNTHROID, LEVOTHROID) 50 MCG tablet Take 50 mcg by mouth daily before breakfast.      . losartan-hydrochlorothiazide (HYZAAR) 50-12.5 MG per tablet Take 1 tablet by mouth every morning.      . travoprost, benzalkonium, (TRAVATAN) 0.004 % ophthalmic solution 1 drop at bedtime.      Marland Kitchen ZOSTAVAX 54098 UNT/0.65ML injection       . donepezil (ARICEPT) 10 MG tablet Take 1 tablet (10 mg total) by mouth at bedtime.  30 tablet  12  . dorzolamide (TRUSOPT) 2 % ophthalmic solution       . sertraline (ZOLOFT) 25 MG tablet One tab po qam xone week, then 2 tabs po qam  60 tablet  12   No facility-administered medications prior to visit.    PAST MEDICAL HISTORY: Past Medical History  Diagnosis Date  . Hypertension   . Hypothyroidism   . BPH (benign prostatic hyperplasia)   . Glaucoma   . GERD (gastroesophageal reflux disease)   . Knee pain, bilateral   . Bipolar 1 disorder   . Hyperpigmentation     left maxilla    PAST SURGICAL HISTORY: Past Surgical History  Procedure Laterality Date  . None      FAMILY HISTORY: Family History  Problem Relation Age of Onset  . Hypothyroidism Son   . Vitiligo Son   . Hashimoto's thyroiditis Grandchild     SOCIAL HISTORY: History   Social History  . Marital Status: Married    Spouse Name: Laxmiben    Number of Children: 3  . Years of Education: college   Occupational History  .      retired   Social History Main Topics  . Smoking status: Former Smoker    Quit date: 08/10/1993  . Smokeless tobacco: Never Used  . Alcohol Use: No  . Drug Use: No  . Sexual Activity: Not on file   Other Topics Concern  . Not  on file   Social History Narrative   Patient is married. (Laxmiben)   Patient lives with  Son Merrie Roof and his wife Lilyan Punt)   Patient is a retired Chief Financial Officer.   Patient has 3 adult children.   Right handed.   Tea with milk.              PHYSICAL EXAM  Filed Vitals:   06/18/14 0945  BP: 108/63  Pulse: 66  Height: 5' 5"  (1.651 m)  Weight: 127 lb (57.607 kg)   Body mass index is 21.13 kg/(m^2). Generalized: In no acute distress  Neck: Supple, no carotid bruits  Cardiac: Regular rate rhythm  Musculoskeletal: No deformity  Neurological examination  Mentation: Alert oriented to time, place, history taking, and causual conversation, MMSE 22/30. AFT 5. Last MMSE 26 /30,  he is not oriented to time, place, missed 3/3 recalls.   Cranial nerve II-XII: Pupils were equal round reactive to light extraocular movements were full, visual field were full on confrontational test. facial sensation and strength were normal. hearing was intact to finger rubbing bilaterally. Uvula tongue midline. head turning and shoulder shrug and were normal and symmetric.Tongue protrusion into cheek strength was normal.  Motor: normal tone, bulk and strength.  Sensory: Intact to fine touch, pinprick, preserved vibratory sensation, and proprioception at toes.  Coordination: Normal finger to nose, heel-to-shin bilaterally there was no truncal ataxia  Gait: push up from seated position, cautious unsteady gait with quad cane, mild limp, difficulty swing his right pelvic region Deep tendon reflexes: Brachioradialis 2/2, biceps 2/2, triceps 2/2, patellar 2/2, Achilles 1/1, plantar responses were flexor bilaterally.      DIAGNOSTIC DATA (LABS, IMAGING, TESTING) - I reviewed patient records, labs, notes, testing and imaging myself where available.   Lab Results  Component Value Date   VITAMINB12 325 08/13/2013      ASSESSMENT AND PLAN  80 y.o. year old male  has a past medical history of Hypertension;  Hypothyroidism;  Bipolar 1 disorder; and gradual onset memory trouble most consistent with Alzheimer's dementia. Neuropsych testing revealed "moderate dementia most likely of the Alzheimer's type. There were no signs of psychiatric behavioral dysfunction. He is not depressed but rather lack of initiative that can be associated with dementia. He does not possess sufficient mental capacity  to make decisions on her own". MRI of the brain with significant generalized atrophy and moderate periventricular small vessel disease.  Begin Aricept 5 mg daily for 1 month then increase to 2 tabs daily Will set up for outpatient physical therapy, neuro rehab Son given information on Alzheimer's disease, resources for adult daycare, safety practices in a home etc. Additional 25 minutes spent face-to-face with the son discussing resources caregiver situation etc. F/U in 6 months Vst time 55 min Dennie Bible, [facility], [facility], [facility] [address], [place], [place] [address] [phone]

## 2014-06-18 NOTE — Patient Instructions (Signed)
Begin Aricept 5 mg daily for 1 month then increase to 2 tabs daily Will set up for outpatient physical therapy F/U in 6 months

## 2014-06-19 ENCOUNTER — Ambulatory Visit (INDEPENDENT_AMBULATORY_CARE_PROVIDER_SITE_OTHER): Payer: Medicare Other | Admitting: Podiatry

## 2014-06-19 DIAGNOSIS — B351 Tinea unguium: Secondary | ICD-10-CM

## 2014-06-19 DIAGNOSIS — M79609 Pain in unspecified limb: Secondary | ICD-10-CM

## 2014-06-19 DIAGNOSIS — M79676 Pain in unspecified toe(s): Secondary | ICD-10-CM

## 2014-06-20 ENCOUNTER — Ambulatory Visit: Payer: Medicare Other | Admitting: Physical Therapy

## 2014-06-20 NOTE — Progress Notes (Signed)
Patient ID: William Dougherty, male   DOB: 04-Jan-1926, 80 y.o.   MRN: [id number]  Subjective: This confused male presents with his son who is requesting debridement of his father's toenails with a history of discomfort in the toenails.  Objective: The toenails are elongated, incurvated, discolored and tender to palpation 6-10  Assessment: Symptomatic onychomycoses 6-10  Plan: Debrided toenails x10 without a bleeding  Reappoint x3 months

## 2014-06-27 ENCOUNTER — Ambulatory Visit: Payer: Medicare Other | Attending: Nurse Practitioner | Admitting: Physical Therapy

## 2014-06-27 DIAGNOSIS — IMO0001 Reserved for inherently not codable concepts without codable children: Secondary | ICD-10-CM | POA: Diagnosis present

## 2014-06-27 DIAGNOSIS — R269 Unspecified abnormalities of gait and mobility: Secondary | ICD-10-CM | POA: Insufficient documentation

## 2014-07-01 ENCOUNTER — Ambulatory Visit: Payer: Medicare Other | Admitting: Physical Therapy

## 2014-07-01 DIAGNOSIS — IMO0001 Reserved for inherently not codable concepts without codable children: Secondary | ICD-10-CM | POA: Diagnosis not present

## 2014-07-08 ENCOUNTER — Ambulatory Visit: Payer: Medicare Other | Admitting: Physical Therapy

## 2014-07-08 DIAGNOSIS — IMO0001 Reserved for inherently not codable concepts without codable children: Secondary | ICD-10-CM | POA: Diagnosis not present

## 2014-07-16 ENCOUNTER — Ambulatory Visit: Payer: Medicare Other | Admitting: Physical Therapy

## 2014-07-22 ENCOUNTER — Ambulatory Visit: Payer: Medicare Other | Attending: Nurse Practitioner | Admitting: Physical Therapy

## 2014-07-22 DIAGNOSIS — R2681 Unsteadiness on feet: Secondary | ICD-10-CM | POA: Insufficient documentation

## 2014-07-22 DIAGNOSIS — Z9181 History of falling: Secondary | ICD-10-CM | POA: Diagnosis not present

## 2014-08-02 ENCOUNTER — Other Ambulatory Visit: Payer: Self-pay

## 2014-09-18 ENCOUNTER — Ambulatory Visit (INDEPENDENT_AMBULATORY_CARE_PROVIDER_SITE_OTHER): Payer: Medicare Other | Admitting: Podiatry

## 2014-09-18 ENCOUNTER — Encounter: Payer: Self-pay | Admitting: Podiatry

## 2014-09-18 VITALS — BP 130/70 | HR 68 | Resp 12

## 2014-09-18 DIAGNOSIS — B351 Tinea unguium: Secondary | ICD-10-CM

## 2014-09-18 DIAGNOSIS — M79676 Pain in unspecified toe(s): Secondary | ICD-10-CM

## 2014-09-19 NOTE — Progress Notes (Signed)
Patient ID: William Dougherty, male   DOB: 04/03/1926, 80 y.o.   MRN: [id number]  Subjective: Non-orientated 3 patient presents with son who is requesting debridement of his father's toenails with a history of discomfort in the toenails when walking wearing shoes.  Objective: The toenails are hypertrophic, elongated, incurvated,and tender to palpation  6-10  Assessment: Symptomatic onychomycoses 6-10  Debridement toenails 10 without a bleeding  Reappoint 3 months

## 2014-12-18 ENCOUNTER — Ambulatory Visit: Payer: Medicare Other | Admitting: Podiatry

## 2014-12-18 ENCOUNTER — Encounter: Payer: Self-pay | Admitting: Neurology

## 2014-12-18 ENCOUNTER — Ambulatory Visit (INDEPENDENT_AMBULATORY_CARE_PROVIDER_SITE_OTHER): Payer: Medicare Other | Admitting: Neurology

## 2014-12-18 VITALS — Ht 65.0 in | Wt 123.0 lb

## 2014-12-18 DIAGNOSIS — R269 Unspecified abnormalities of gait and mobility: Secondary | ICD-10-CM

## 2014-12-18 DIAGNOSIS — F0390 Unspecified dementia without behavioral disturbance: Secondary | ICD-10-CM

## 2014-12-18 MED ORDER — MEMANTINE HCL 10 MG PO TABS: 10.0000 mg | ORAL_TABLET | Freq: Two times a day (BID) | ORAL | Status: DC

## 2014-12-18 NOTE — Progress Notes (Signed)
[facility]  PATIENT: William Dougherty DOB: Feb 20, 1926  HISTORY OF PRESENT ILLNESS: William Dougherty, 81 year old male returns for followup for dementia, he is accompanied by his son. He was last in the office September 2015 by Hoyle Sauer.   William Dougherty was referred by his primary care physician Dr. Maury Dus for evaluation of gradual onset memory trouble.   He is a retired Chief Financial Officer, has past medical history of hypothyroidism, on supplement, hypertension. He lives with his son's family, since 2013, he had gradual onset memory loss, he was noted to repeat himself, become very forgetful, one hour after eating breakfast, he would forget that he has eaten breakfast, he spent most of his day watching TV, not interested in any activities,  He has gait trouble, walking with a cane, he complains of bilateral knee pain, denies significant low back pain, over the past 6 months, he has worsening memory trouble, also developed urinary incontinence, He wet himself himself on his way to the bathroom. He denies bowel incontinence  In 2008, he had confusion, hyperactivity, calling people at night, he was treated with seroqueal, he became sleepy all the time,    MRI of the brain in Nov 2014: moderate to severe atrophy, mild small vessel disease, laboratory showed a normal ESR, C-reactive protein, vitamin B12, methylmalonic acid level.  X-ray of bilateral hip showed no significant abnormality, x-ray of bilateral knee showed degenerative arthritis, with evidence of effusion.   He continued to have gait difficulty. he denies low back pain, he needs prompt to take a bath, using bathroom, otherwise he would make accident,   He had neuropsych testing with Dr. Valentina Shaggy in 2015,  This revealed" moderate dementia most likely of the Alzheimer's type. There were no signs of psychiatric behavioral dysfunction. He is not depressed but rather lack of initiative that can be associated with dementia. He does not possess  sufficient mental capacity  to make decisions on his own". The son reports that he needs assistance with bathing and toileting. He can feed himself once  his food is prepared and he has a good appetite. He is sleeping well without any wandering behavior. There have been no safety concerns identified. William Dougherty lives with his son. He has been granted 4 hours of care 7 days a week by Rande Brunt  UPDATE March 2nd 2016: He continues to decline steadily, he is sedentary, sititng down most of times, he is more confused, needing helps at his daily activity, including toileting, bathing, increased gait difficulty, fell few times,  He sleeps right after dinner, sometimes woke up in the middle of the night, he also has more difficulty swallowing, talking,  REVIEW OF SYSTEMS: Full 14 system review of systems performed and notable only for those listed, all others are neg: Speech difficulty, depression, memory loss   ALLERGIES: Allergies  Allergen Reactions  . Eggs Or Egg-Derived Products Diarrhea and Nausea Only    HOME MEDICATIONS: Outpatient Prescriptions Prior to Visit  Medication Sig Dispense Refill  . BOOSTRIX 5-2.5-18.5 injection     . cholecalciferol (VITAMIN D) 1000 UNITS tablet Take 2,000 Units by mouth daily.    Marland Kitchen donepezil (ARICEPT) 5 MG tablet Take 1 tablet (5 mg total) by mouth daily. 52m po daily for 1 month then increase to 2 tabs daily 60 tablet 6  . dutasteride (AVODART) 0.5 MG capsule Take 0.5 mg by mouth daily.    . influenza virus trivalent vaccine (FLUZONE MDV) injection Inject 0.5 mLs into the muscle once. Per records  received flu shot on 06/05/2013.    . levothyroxine (SYNTHROID, LEVOTHROID) 50 MCG tablet Take 50 mcg by mouth daily before breakfast.    . losartan-hydrochlorothiazide (HYZAAR) 50-12.5 MG per tablet Take 1 tablet by mouth every morning.    . travoprost, benzalkonium, (TRAVATAN) 0.004 % ophthalmic solution 1 drop at bedtime.    Marland Kitchen ZOSTAVAX 93790 UNT/0.65ML injection       No facility-administered medications prior to visit.    PAST MEDICAL HISTORY: Past Medical History  Diagnosis Date  . Hypertension   . Hypothyroidism   . BPH (benign prostatic hyperplasia)   . Glaucoma   . GERD (gastroesophageal reflux disease)   . Knee pain, bilateral   . Bipolar 1 disorder   . Hyperpigmentation     left maxilla    PAST SURGICAL HISTORY: Past Surgical History  Procedure Laterality Date  . None      FAMILY HISTORY: Family History  Problem Relation Age of Onset  . Hypothyroidism Son   . Vitiligo Son   . Hashimoto's thyroiditis Grandchild     SOCIAL HISTORY: History   Social History  . Marital Status: Married    Spouse Name: Laxmiben  . Number of Children: 3  . Years of Education: college   Occupational History  .      retired   Social History Main Topics  . Smoking status: Former Smoker    Quit date: 08/10/1993  . Smokeless tobacco: Never Used  . Alcohol Use: No  . Drug Use: No  . Sexual Activity: Not on file   Other Topics Concern  . Not on file   Social History Narrative   Patient is married. (Laxmiben)   Patient lives with  Son William Dougherty and his wife Lilyan Punt)   Patient is a retired Chief Financial Officer.   Patient has 3 adult children.   Right handed.   Tea with milk.              PHYSICAL EXAM  Filed Vitals:   12/18/14 1143  Height: 5' 5"  (1.651 m)  Weight: 123 lb (55.792 kg)   Body mass index is 20.47 kg/(m^2).  PHYSICAL EXAMNIATION:  Gen: NAD, conversant, well nourised, obese, well groomed                     Cardiovascular: Regular rate rhythm, no peripheral edema, warm, nontender. Eyes: Conjunctivae clear without exudates or hemorrhage Neck: Supple, no carotid bruise. Pulmonary: Clear to auscultation bilaterally   NEUROLOGICAL EXAM:  MENTAL STATUS: Speech:  slow spastic tongue movement, relies on family to provide history Cognition:    MMSE 16/30, he is not oriented to time, place, missed 1/3 recall, could not copy  design, and write sentence. CRANIAL NERVES: CN II: Visual fields are full to confrontation. Fundoscopic exam is normal with sharp discs and no vascular changes. Venous pulsations are present bilaterally. Pupils are 4 mm and briskly reactive to light. Visual acuity is 20/20 bilaterally. CN III, IV, VI: extraocular movement are normal. No ptosis. CN V: Facial sensation is intact to pinprick in all 3 divisions bilaterally. Corneal responses are intact.  CN VII: Face is symmetric with normal eye closure and smile. CN VIII: Hearing is normal to rubbing fingers CN IX, X: Palate elevates symmetrically. Phonation is normal. CN XI: Head turning and shoulder shrug are intact CN XII: Tongue is midline with normal movements and no atrophy.  MOTOR: There is no pronator drift of out-stretched arms. Normal muscle tone, variable effort on muscle  strength examination   Shoulder abduction Shoulder external rotation Elbow flexion Elbow extension Wrist flexion Wrist extension Finger abduction Hip flexion Knee flexion Knee extension Ankle dorsi flexion Ankle plantar flexion  R 5 5 5 5 5 5 5 5 5 5 4 4   L 5 5 5 5 5 5 5 5 5 5  5- 4+    REFLEXES: Reflexes are 2+ and symmetric at the biceps, triceps, knees, and ankles. Plantar responses are flexor.  SENSORY: Not reliable  COORDINATION: Rapid alternating movements and fine finger movements are intact. There is no dysmetria on finger-to-nose and heel-knee-shin. There are no abnormal or extraneous movements.   GAIT/STANCE: Need to push up from seated position, dragging both leg, right worse than left, relies on his walker,   DIAGNOSTIC DATA (LABS, IMAGING, TESTING) - I reviewed patient records, labs, notes, testing and imaging myself where available.   Lab Results  Component Value Date   VITAMINB12 325 08/13/2013      ASSESSMENT AND PLAN  81 y.o. year old male  has a past medical history of Hypertension; Hypothyroidism;  gradual onset memory trouble  most consistent with Alzheimer's dementia. Neuropsych testing revealed "moderate dementia most likely of the Alzheimer's type. Continued to decline steadily, need more help in his daily activity, occasionally bowel and bladder incontinence, unsteady gait, slow worsening dysarthria, dysphagia,  1, home health nurse for speech evaluation, physical therapy, home health nurse, 2. Add on Namenda 10 mg twice a day 3, return to clinic in 3-4 months.     Marcial Pacas, M.D. Ph.D.  [facility] [place], [place] [address] Phone: [phone] Fax:      [phone]

## 2014-12-23 ENCOUNTER — Ambulatory Visit (INDEPENDENT_AMBULATORY_CARE_PROVIDER_SITE_OTHER): Payer: Medicare Other | Admitting: Podiatry

## 2014-12-23 DIAGNOSIS — M79676 Pain in unspecified toe(s): Secondary | ICD-10-CM | POA: Diagnosis not present

## 2014-12-23 DIAGNOSIS — B351 Tinea unguium: Secondary | ICD-10-CM

## 2014-12-23 NOTE — Progress Notes (Signed)
Patient ID: William Dougherty Grace, male   DOB: 11/07/1925, 81 y.o.   MRN: [id number]  Subjective: This confused patient presents with his son who is requesting debridement of his father's toenails with a history of discomfort in the toenails.  Objective: The toenails are elongated, hypertrophic, incurvated, discolored and tender to palpation 6-10  Assessment: Symptomatic onychomycoses 6-10  Plan: Debridement of toenails 10 without a bleeding  Reappoint 3 months

## 2014-12-29 ENCOUNTER — Emergency Department (HOSPITAL_COMMUNITY)
Admission: EM | Admit: 2014-12-29 | Discharge: 2014-12-29 | Disposition: A | Discharge status: Home / Self Care | Payer: Medicare Other | Attending: Emergency Medicine | Admitting: Emergency Medicine

## 2014-12-29 ENCOUNTER — Encounter (HOSPITAL_COMMUNITY): Payer: Self-pay | Admitting: *Deleted

## 2014-12-29 ENCOUNTER — Emergency Department (HOSPITAL_COMMUNITY): Payer: Medicare Other

## 2014-12-29 DIAGNOSIS — S0033XA Contusion of nose, initial encounter: Secondary | ICD-10-CM | POA: Diagnosis not present

## 2014-12-29 DIAGNOSIS — F319 Bipolar disorder, unspecified: Secondary | ICD-10-CM | POA: Insufficient documentation

## 2014-12-29 DIAGNOSIS — I10 Essential (primary) hypertension: Secondary | ICD-10-CM | POA: Diagnosis not present

## 2014-12-29 DIAGNOSIS — W01198A Fall on same level from slipping, tripping and stumbling with subsequent striking against other object, initial encounter: Secondary | ICD-10-CM | POA: Diagnosis not present

## 2014-12-29 DIAGNOSIS — Y92012 Bathroom of single-family (private) house as the place of occurrence of the external cause: Secondary | ICD-10-CM | POA: Insufficient documentation

## 2014-12-29 DIAGNOSIS — H409 Unspecified glaucoma: Secondary | ICD-10-CM | POA: Insufficient documentation

## 2014-12-29 DIAGNOSIS — R040 Epistaxis: Secondary | ICD-10-CM | POA: Diagnosis not present

## 2014-12-29 DIAGNOSIS — Z8719 Personal history of other diseases of the digestive system: Secondary | ICD-10-CM | POA: Diagnosis not present

## 2014-12-29 DIAGNOSIS — Y998 Other external cause status: Secondary | ICD-10-CM | POA: Diagnosis not present

## 2014-12-29 DIAGNOSIS — F0390 Unspecified dementia without behavioral disturbance: Secondary | ICD-10-CM | POA: Insufficient documentation

## 2014-12-29 DIAGNOSIS — Y9389 Activity, other specified: Secondary | ICD-10-CM | POA: Insufficient documentation

## 2014-12-29 DIAGNOSIS — E039 Hypothyroidism, unspecified: Secondary | ICD-10-CM | POA: Insufficient documentation

## 2014-12-29 DIAGNOSIS — Z79899 Other long term (current) drug therapy: Secondary | ICD-10-CM | POA: Diagnosis not present

## 2014-12-29 DIAGNOSIS — Z87891 Personal history of nicotine dependence: Secondary | ICD-10-CM | POA: Insufficient documentation

## 2014-12-29 DIAGNOSIS — S0993XA Unspecified injury of face, initial encounter: Secondary | ICD-10-CM | POA: Diagnosis present

## 2014-12-29 DIAGNOSIS — W19XXXA Unspecified fall, initial encounter: Secondary | ICD-10-CM

## 2014-12-29 MED ORDER — BACITRACIN-NEOMYCIN-POLYMYXIN OINTMENT TUBE
TOPICAL_OINTMENT | Freq: Two times a day (BID) | CUTANEOUS | Status: DC
  Administered 2014-12-29: 1 via TOPICAL
  Filled 2014-12-29: qty 15

## 2014-12-29 MED ORDER — DOUBLE ANTIBIOTIC 500-10000 UNIT/GM EX OINT
TOPICAL_OINTMENT | Freq: Two times a day (BID) | CUTANEOUS | Status: DC
  Filled 2014-12-29 (×17): qty 1

## 2014-12-29 MED ORDER — ACETAMINOPHEN 500 MG PO TABS: 1000.0000 mg | ORAL_TABLET | Freq: Four times a day (QID) | ORAL | Status: AC | PRN

## 2014-12-29 NOTE — ED Notes (Signed)
Cleanse lac to facial with saline and applied neosporin oint. Pt tolerated well. Neosporin tube sent home with pt and family with application instructions given. Verbalized understanding.

## 2014-12-29 NOTE — ED Notes (Signed)
Pt son reports the pt fell onto his face onto a tile floor. Pt had a nosebleed lasing ~10 minutes. Bleeding appears to have stopped at this time. Pt not on blood thinners. Pt has hx of dementia, son states he is at baseline mental status.

## 2014-12-29 NOTE — ED Notes (Signed)
Awake. Verbally responsive. A/O x4. Resp even and unlabored. No audible adventitious breath sounds noted. ABC's intact.  

## 2014-12-29 NOTE — Discharge Instructions (Signed)
Head Injury °You have received a head injury. It does not appear serious at this time. Headaches and vomiting are common following head injury. It should be easy to awaken from sleeping. Sometimes it is necessary for you to stay in the emergency department for a while for observation. Sometimes admission to the hospital may be needed. After injuries such as yours, most problems occur within the first 24 hours, but side effects may occur up to 7-10 days after the injury. It is important for you to carefully monitor your condition and contact your health care provider or seek immediate medical care if there is a change in your condition. °WHAT ARE THE TYPES OF HEAD INJURIES? °Head injuries can be as minor as a bump. Some head injuries can be more severe. More severe head injuries include: °· A jarring injury to the brain (concussion). °· A bruise of the brain (contusion). This mean there is bleeding in the brain that can cause swelling. °· A cracked skull (skull fracture). °· Bleeding in the brain that collects, clots, and forms a bump (hematoma). °WHAT CAUSES A HEAD INJURY? °A serious head injury is most likely to happen to someone who is in a car wreck and is not wearing a seat belt. Other causes of major head injuries include bicycle or motorcycle accidents, sports injuries, and falls. °HOW ARE HEAD INJURIES DIAGNOSED? °A complete history of the event leading to the injury and your current symptoms will be helpful in diagnosing head injuries. Many times, pictures of the brain, such as CT or MRI are needed to see the extent of the injury. Often, an overnight hospital stay is necessary for observation.  °WHEN SHOULD I SEEK IMMEDIATE MEDICAL CARE?  °You should get help right away if: °· You have confusion or drowsiness. °· You feel sick to your stomach (nauseous) or have continued, forceful vomiting. °· You have dizziness or unsteadiness that is getting worse. °· You have severe, continued headaches not relieved by  medicine. Only take over-the-counter or prescription medicines for pain, fever, or discomfort as directed by your health care provider. °· You do not have normal function of the arms or legs or are unable to walk. °· You notice changes in the black spots in the center of the colored part of your eye (pupil). °· You have a clear or bloody fluid coming from your nose or ears. °· You have a loss of vision. °During the next 24 hours after the injury, you must stay with someone who can watch you for the warning signs. This person should contact local emergency services (911 in the [place] if you have seizures, you become unconscious, or you are unable to wake up. °HOW CAN I PREVENT A HEAD INJURY IN THE FUTURE? °The most important factor for preventing major head injuries is avoiding motor vehicle accidents.  To minimize the potential for damage to your head, it is crucial to wear seat belts while riding in motor vehicles. Wearing helmets while bike riding and playing collision sports (like football) is also helpful. Also, avoiding dangerous activities around the house will further help reduce your risk of head injury.  °WHEN CAN I RETURN TO NORMAL ACTIVITIES AND ATHLETICS? °You should be reevaluated by your health care provider before returning to these activities. If you have any of the following symptoms, you should not return to activities or contact sports until 1 week after the symptoms have stopped: °· Persistent headache. °· Dizziness or vertigo. °· Poor attention and concentration. °· Confusion. °·   Memory problems. °· Nausea or vomiting. °· Fatigue or tire easily. °· Irritability. °· Intolerant of bright lights or loud noises. °· Anxiety or depression. °· Disturbed sleep. °MAKE SURE YOU:  °· Understand these instructions. °· Will watch your condition. °· Will get help right away if you are not doing well or get worse. °Document Released: 10/04/2005 Document Revised: 10/09/2013 Document Reviewed:  06/11/2013 °[employer]® Patient Information ©2015 [employer]. This information is not intended to replace advice given to you by your health care provider. Make sure you discuss any questions you have with your health care provider. ° °Abrasion °An abrasion is a cut or scrape of the skin. Abrasions do not extend through all layers of the skin and most heal within 10 days. It is important to care for your abrasion properly to prevent infection. °CAUSES  °Most abrasions are caused by falling on, or gliding across, the ground or other surface. When your skin rubs on something, the outer and inner layer of skin rubs off, causing an abrasion. °DIAGNOSIS  °Your caregiver will be able to diagnose an abrasion during a physical exam.  °TREATMENT  °Your treatment depends on how large and deep the abrasion is. Generally, your abrasion will be cleaned with water and a mild soap to remove any dirt or debris. An antibiotic ointment may be put over the abrasion to prevent an infection. A bandage (dressing) may be wrapped around the abrasion to keep it from getting dirty.  °You may need a tetanus shot if: °· You cannot remember when you had your last tetanus shot. °· You have never had a tetanus shot. °· The injury broke your skin. °If you get a tetanus shot, your arm may swell, get red, and feel warm to the touch. This is common and not a problem. If you need a tetanus shot and you choose not to have one, there is a rare chance of getting tetanus. Sickness from tetanus can be serious.  °HOME CARE INSTRUCTIONS  °· If a dressing was applied, change it at least once a day or as directed by your caregiver. If the bandage sticks, soak it off with warm water.   °· Wash the area with water and a mild soap to remove all the ointment 2 times a day. Rinse off the soap and pat the area dry with a clean towel.   °· Reapply any ointment as directed by your caregiver. This will help prevent infection and keep the bandage from sticking. Use  gauze over the wound and under the dressing to help keep the bandage from sticking.   °· Change your dressing right away if it becomes wet or dirty.   °· Only take over-the-counter or prescription medicines for pain, discomfort, or fever as directed by your caregiver.   °· Follow up with your caregiver within 24-48 hours for a wound check, or as directed. If you were not given a wound-check appointment, look closely at your abrasion for redness, swelling, or pus. These are signs of infection. °SEEK IMMEDIATE MEDICAL CARE IF:  °· You have increasing pain in the wound.   °· You have redness, swelling, or tenderness around the wound.   °· You have pus coming from the wound.   °· You have a fever or persistent symptoms for more than 2-3 days. °· You have a fever and your symptoms suddenly get worse. °· You have a bad smell coming from the wound or dressing.   °MAKE SURE YOU:  °· Understand these instructions. °· Will watch your condition. °· Will get   help right away if you are not doing well or get worse. °Document Released: 07/14/2005 Document Revised: 09/20/2012 Document Reviewed: 09/07/2011 °[employer]® Patient Information ©2015 [employer]. This information is not intended to replace advice given to you by your health care provider. Make sure you discuss any questions you have with your health care provider. ° °

## 2014-12-29 NOTE — ED Provider Notes (Signed)
CSN: [id number]     Arrival date & time 12/29/14  1610 History   First MD Initiated Contact with Patient 12/28/77 1700     Chief Complaint  Patient presents with  . Fall  . Epistaxis     (Consider location/radiation/quality/duration/timing/severity/associated sxs/prior Treatment) HPI The patient fell in the bathroom. There was no loss of consciousness. He did hit his face and had a nose bleed for about 10 minutes. The bleed stopped prior to arrival to the emergency department. He is not on any blood thinners. Family members note that his behavior and mental status are normal at baseline. He does have a baseline significant dementia. He does not complain of any pain. Past Medical History  Diagnosis Date  . Hypertension   . Hypothyroidism   . BPH (benign prostatic hyperplasia)   . Glaucoma   . GERD (gastroesophageal reflux disease)   . Knee pain, bilateral   . Bipolar 1 disorder   . Hyperpigmentation     left maxilla   Past Surgical History  Procedure Laterality Date  . None     Family History  Problem Relation Age of Onset  . Hypothyroidism Son   . Vitiligo Son   . Hashimoto's thyroiditis Grandchild    History  Substance Use Topics  . Smoking status: Former Smoker    Quit date: 08/10/1993  . Smokeless tobacco: Never Used  . Alcohol Use: No    Review of Systems  10 Systems reviewed and are negative for acute change except as noted in the HPI. Level V caveat secondary to dementia. Family does not report any recent fever chills, cough, vomiting or diarrhea.   Allergies  Eggs or egg-derived products  Home Medications   Prior to Admission medications   Medication Sig Start Date End Date Taking? Authorizing Provider  dutasteride (AVODART) 0.5 MG capsule Take 0.5 mg by mouth daily.   Yes Historical Provider, MD  levothyroxine (SYNTHROID, LEVOTHROID) 50 MCG tablet Take 50 mcg by mouth daily before breakfast.   Yes Historical Provider, MD  losartan-hydrochlorothiazide  (HYZAAR) 50-12.5 MG per tablet Take 1 tablet by mouth every morning.   Yes Historical Provider, MD  memantine (NAMENDA) 10 MG tablet Take 1 tablet (10 mg total) by mouth 2 (two) times daily. 12/18/14  Yes Levert Feinstein, MD  Multiple Vitamins-Minerals (MULTIVITAMIN & MINERAL PO) Take 1 tablet by mouth daily.   Yes Historical Provider, MD  travoprost, benzalkonium, (TRAVATAN) 0.004 % ophthalmic solution Place 1 drop into both eyes at bedtime.    Yes Historical Provider, MD  acetaminophen (TYLENOL) 500 MG tablet Take 2 tablets (1,000 mg total) by mouth every 6 (six) hours as needed. 12/29/14   Arby Barrette, MD  donepezil (ARICEPT) 5 MG tablet Take 1 tablet (5 mg total) by mouth daily.  po daily for 1 month then increase to 2 tabs daily Patient not taking: Reported on 12/29/2014 06/18/14   Lynder Parents, NP   BP 140/79 mmHg  Pulse 72  Temp(Src) 98.5 F (36.9 C) (Oral)  Resp 19  SpO2 98% Physical Exam  Constitutional: He appears well-developed and well-nourished.  HENT:  Right Ear: External ear normal.  Left Ear: External ear normal.  Mouth/Throat: Oropharynx is clear and moist. No oropharyngeal exudate.  The patient has a approximate 4 mm abrasion in the right eyebrow. There is no gaping laceration associated. He has a abrasion over the bridge of the nose. Internal examination of the nose shows some clotted blood in the hairs but the nasal  passages are patent without any gross clot within the airways. Tongue depressor examination of the posterior oropharynx shows no dripping of any blood or blood-tinged saliva in the back of the mouth.  Eyes: EOM are normal. Pupils are equal, round, and reactive to light.  Neck: Neck supple.  Cardiovascular: Normal rate, regular rhythm, normal heart sounds and intact distal pulses.   Pulmonary/Chest: Effort normal and breath sounds normal.  Abdominal: Soft. Bowel sounds are normal. He exhibits no distension. There is no tenderness.  Musculoskeletal: Normal range of  motion. He exhibits no edema.  Patient does not have any reproducible tenderness to palpation of his thoracic chest or back nor his spinal column. No areas of CVA tenderness. There was no tenderness to compression of the iliac wings are the trochanters. I put the patient through full range of motion of the lower extremities which he can do spontaneously at command. He does not show limitations or pain. He has chronic arthritic changes of the knees but neither have significant contusion or abrasion associated. Upper extremities are without deformity or decrease in baseline range of motion.  Neurological: He is alert. He has normal strength. No cranial nerve deficit. He exhibits normal muscle tone. Coordination normal. GCS eye subscore is 4. GCS verbal subscore is 5. GCS motor subscore is 6.  Skin: Skin is warm, dry and intact.  Psychiatric: He has a normal mood and affect.    ED Course  Procedures (including critical care time) Labs Review Labs Reviewed - No data to display  Imaging Review Ct Head Wo Contrast  12/29/2014   CLINICAL DATA:  81 year old who fell onto his face on a tile floor earlier today with a nosebleed that lasted approximately 10 min. Current history of dementia. Initial encounter.  EXAM: CT HEAD WITHOUT CONTRAST  CT MAXILLOFACIAL WITHOUT CONTRAST  TECHNIQUE: Multidetector CT imaging of the head and maxillofacial structures were performed using the standard protocol without intravenous contrast. Multiplanar CT image reconstructions of the maxillofacial structures were also generated. A metallic BB was placed on the right temple in order to reliably differentiate right from left.  COMPARISON:  MRI brain 08/20/2013, 11/28/2006. No prior head CT. No prior maxillofacial imaging.  FINDINGS: CT HEAD FINDINGS  Patient motion blurred images of the skull base. Severe cortical and deep atrophy and moderate cerebellar atrophy, unchanged from the prior MRI. Encephalomalacia involving the right  occipital lobe, unchanged. Moderate changes of small vessel disease of the white matter diffusely. Old lacunar strokes in the basal ganglia bilaterally, unchanged. No mass lesion. No midline shift. No acute hemorrhage or hematoma. No extra-axial fluid collections. No evidence of acute infarction.  No skull fracture or other focal osseous abnormality involving the skull. Bilateral mastoid air cells and middle ear cavities well aerated. Mild-to-moderate bilateral carotid siphon and vertebral artery atherosclerosis.  CT MAXILLOFACIAL FINDINGS  Patient motion blurred most of the images, making the examination less than optimal. This renders the reconstructed images uninterpretable.  Allowing for this, no facial bone fractures identified. Mucous retention cyst or polyp in the left maxillary sinus with an associated air-fluid level. Remaining paranasal sinuses well aerated. Orbits and globes intact.  IMPRESSION: 1. No acute intracranial abnormality. 2. Remote right occipital lobe cortical stroke, unchanged. Stable severe generalized atrophy and mild chronic microvascular ischemic changes of the white matter. 3. Patient motion blurred most of the images of the maxillofacial CT. With this limitation, no facial bone fractures are identified. 4. Mild acute superimposed upon chronic sinusitis involving the left maxillary  sinus.   Electronically Signed   By: Hulan Saashomas  Lawrence M.D.   On: 12/29/2014 18:56   Ct Maxillofacial Wo Cm  12/29/2014   CLINICAL DATA:  81 year old who fell onto his face on a tile floor earlier today with a nosebleed that lasted approximately 10 min. Current history of dementia. Initial encounter.  EXAM: CT HEAD WITHOUT CONTRAST  CT MAXILLOFACIAL WITHOUT CONTRAST  TECHNIQUE: Multidetector CT imaging of the head and maxillofacial structures were performed using the standard protocol without intravenous contrast. Multiplanar CT image reconstructions of the maxillofacial structures were also generated. A  metallic BB was placed on the right temple in order to reliably differentiate right from left.  COMPARISON:  MRI brain 08/20/2013, 11/28/2006. No prior head CT. No prior maxillofacial imaging.  FINDINGS: CT HEAD FINDINGS  Patient motion blurred images of the skull base. Severe cortical and deep atrophy and moderate cerebellar atrophy, unchanged from the prior MRI. Encephalomalacia involving the right occipital lobe, unchanged. Moderate changes of small vessel disease of the white matter diffusely. Old lacunar strokes in the basal ganglia bilaterally, unchanged. No mass lesion. No midline shift. No acute hemorrhage or hematoma. No extra-axial fluid collections. No evidence of acute infarction.  No skull fracture or other focal osseous abnormality involving the skull. Bilateral mastoid air cells and middle ear cavities well aerated. Mild-to-moderate bilateral carotid siphon and vertebral artery atherosclerosis.  CT MAXILLOFACIAL FINDINGS  Patient motion blurred most of the images, making the examination less than optimal. This renders the reconstructed images uninterpretable.  Allowing for this, no facial bone fractures identified. Mucous retention cyst or polyp in the left maxillary sinus with an associated air-fluid level. Remaining paranasal sinuses well aerated. Orbits and globes intact.  IMPRESSION: 1. No acute intracranial abnormality. 2. Remote right occipital lobe cortical stroke, unchanged. Stable severe generalized atrophy and mild chronic microvascular ischemic changes of the white matter. 3. Patient motion blurred most of the images of the maxillofacial CT. With this limitation, no facial bone fractures are identified. 4. Mild acute superimposed upon chronic sinusitis involving the left maxillary sinus.   Electronically Signed   By: Hulan Saashomas  Lawrence M.D.   On: 12/29/2014 18:56     EKG Interpretation None      MDM   Final diagnoses:  Fall, initial encounter  Contusion, nose, initial encounter   Epistaxis   The patient has taken a fall at home without loss of consciousness or mental status change. CT does not show any bleeding evidence. External examination shows contusion and abrasion over the nose that were suggestive of a nasal fracture however none is identified specifically on CT. Symptomatic treatment advice is given. The patient is not on any blood thinners.    Arby BarretteMarcy Siennah Barrasso, MD 12/29/14 [phone]

## 2014-12-29 NOTE — ED Notes (Signed)
Cleanse facial lac with setrile saline and applied neosporin oint. Pt tolerated well.

## 2015-01-11 ENCOUNTER — Emergency Department (HOSPITAL_COMMUNITY): Payer: Medicare Other

## 2015-01-11 ENCOUNTER — Emergency Department (HOSPITAL_COMMUNITY)
Admission: EM | Admit: 2015-01-11 | Discharge: 2015-01-11 | Disposition: A | Discharge status: Home / Self Care | Payer: Medicare Other | Attending: Emergency Medicine | Admitting: Emergency Medicine

## 2015-01-11 ENCOUNTER — Encounter (HOSPITAL_COMMUNITY): Payer: Self-pay

## 2015-01-11 DIAGNOSIS — W19XXXA Unspecified fall, initial encounter: Secondary | ICD-10-CM

## 2015-01-11 DIAGNOSIS — Y9389 Activity, other specified: Secondary | ICD-10-CM | POA: Insufficient documentation

## 2015-01-11 DIAGNOSIS — I10 Essential (primary) hypertension: Secondary | ICD-10-CM | POA: Insufficient documentation

## 2015-01-11 DIAGNOSIS — S022XXB Fracture of nasal bones, initial encounter for open fracture: Secondary | ICD-10-CM | POA: Insufficient documentation

## 2015-01-11 DIAGNOSIS — Z79899 Other long term (current) drug therapy: Secondary | ICD-10-CM | POA: Diagnosis not present

## 2015-01-11 DIAGNOSIS — W1839XA Other fall on same level, initial encounter: Secondary | ICD-10-CM | POA: Insufficient documentation

## 2015-01-11 DIAGNOSIS — Y92008 Other place in unspecified non-institutional (private) residence as the place of occurrence of the external cause: Secondary | ICD-10-CM | POA: Insufficient documentation

## 2015-01-11 DIAGNOSIS — Z8719 Personal history of other diseases of the digestive system: Secondary | ICD-10-CM | POA: Diagnosis not present

## 2015-01-11 DIAGNOSIS — H409 Unspecified glaucoma: Secondary | ICD-10-CM | POA: Diagnosis not present

## 2015-01-11 DIAGNOSIS — S01511A Laceration without foreign body of lip, initial encounter: Secondary | ICD-10-CM | POA: Diagnosis not present

## 2015-01-11 DIAGNOSIS — Y998 Other external cause status: Secondary | ICD-10-CM | POA: Diagnosis not present

## 2015-01-11 DIAGNOSIS — S0181XA Laceration without foreign body of other part of head, initial encounter: Secondary | ICD-10-CM | POA: Diagnosis not present

## 2015-01-11 DIAGNOSIS — Z87891 Personal history of nicotine dependence: Secondary | ICD-10-CM | POA: Diagnosis not present

## 2015-01-11 DIAGNOSIS — N400 Enlarged prostate without lower urinary tract symptoms: Secondary | ICD-10-CM | POA: Insufficient documentation

## 2015-01-11 DIAGNOSIS — E039 Hypothyroidism, unspecified: Secondary | ICD-10-CM | POA: Insufficient documentation

## 2015-01-11 DIAGNOSIS — S0993XA Unspecified injury of face, initial encounter: Secondary | ICD-10-CM | POA: Diagnosis present

## 2015-01-11 DIAGNOSIS — F0390 Unspecified dementia without behavioral disturbance: Secondary | ICD-10-CM | POA: Insufficient documentation

## 2015-01-11 MED ORDER — CEPHALEXIN 250 MG/5ML PO SUSR: 500.0000 mg | Freq: Two times a day (BID) | ORAL | Status: AC

## 2015-01-11 MED ORDER — TETANUS-DIPHTH-ACELL PERTUSSIS 5-2.5-18.5 LF-MCG/0.5 IM SUSP: 0.5000 mL | Freq: Once | INTRAMUSCULAR | Status: DC

## 2015-01-11 MED ORDER — LIDOCAINE HCL 1 % IJ SOLN
30.0000 mL | Freq: Once | INTRAMUSCULAR | Status: DC
  Filled 2015-01-11: qty 40

## 2015-01-11 NOTE — ED Provider Notes (Signed)
CSN: [id number]     Arrival date & time 01/11/15  1053 History   First MD Initiated Contact with Patient 01/11/15 1115     No chief complaint on file.    (Consider location/radiation/quality/duration/timing/severity/associated sxs/prior Treatment) HPI Comments: Patient from home after witnessed mechanical fall. History of dementia and unable to give a history. No loss of consciousness. He states patient was attempted to walk without his walker and tumbled forward striking his head on the floor. He does not take any blood thinners. His behavior has been normal. He does not complain of any pain.  The history is provided by the patient.    Past Medical History  Diagnosis Date  . Hypertension   . Hypothyroidism   . BPH (benign prostatic hyperplasia)   . Glaucoma   . GERD (gastroesophageal reflux disease)   . Knee pain, bilateral   . Bipolar 1 disorder   . Hyperpigmentation     left maxilla   Past Surgical History  Procedure Laterality Date  . None     Family History  Problem Relation Age of Onset  . Hypothyroidism Son   . Vitiligo Son   . Hashimoto's thyroiditis Grandchild    History  Substance Use Topics  . Smoking status: Former Smoker    Quit date: 08/10/1993  . Smokeless tobacco: Never Used  . Alcohol Use: No    Review of Systems  Unable to perform ROS: Dementia      Allergies  Eggs or egg-derived products  Home Medications   Prior to Admission medications   Medication Sig Start Date End Date Taking? Authorizing Provider  acetaminophen (TYLENOL) 500 MG tablet Take 2 tablets (1,000 mg total) by mouth every 6 (six) hours as needed. 12/29/14  Yes Arby Barrette, MD  dutasteride (AVODART) 0.5 MG capsule Take 0.5 mg by mouth daily.   Yes Historical Provider, MD  levothyroxine (SYNTHROID, LEVOTHROID) 50 MCG tablet Take 50 mcg by mouth daily before breakfast.   Yes Historical Provider, MD  losartan-hydrochlorothiazide (HYZAAR) 50-12.5 MG per tablet Take 1 tablet by  mouth every morning.   Yes Historical Provider, MD  memantine (NAMENDA) 10 MG tablet Take 1 tablet (10 mg total) by mouth 2 (two) times daily. 12/18/14  Yes Levert Feinstein, MD  Multiple Vitamins-Minerals (MULTIVITAMIN & MINERAL PO) Take 1 tablet by mouth daily.   Yes Historical Provider, MD  travoprost, benzalkonium, (TRAVATAN) 0.004 % ophthalmic solution Place 1 drop into both eyes at bedtime.    Yes Historical Provider, MD  cephALEXin (KEFLEX) 250 MG/5ML suspension Take 10 mLs (500 mg total) by mouth 2 (two) times daily. 01/11/15 01/18/15  Glynn Octave, MD  donepezil (ARICEPT) 5 MG tablet Take 1 tablet (5 mg total) by mouth daily. 5mg  po daily for 1 month then increase to 2 tabs daily Patient not taking: Reported on 12/29/2014 06/18/14   Lynder Parents, NP   BP 145/81 mmHg  Pulse 85  Temp(Src) 97.9 F (36.6 C) (Oral)  Resp 15  SpO2 97% Physical Exam  Constitutional: He appears well-developed and well-nourished. No distress.  HENT:  Head: Normocephalic.  Mouth/Throat: Oropharynx is clear and moist.  2 cm laceration to glabella. EOMI.   blood bilateral nares with slight oozing. No septal hematoma. No blood in posterior pharynx  1 cm laceration mucosal surface of right upper lip. No vermillion border involvement.   Eyes: Conjunctivae are normal. Pupils are equal, round, and reactive to light.  Neck: Normal range of motion. Neck supple.  Cardiovascular: Normal rate,  regular rhythm and normal heart sounds.   No murmur heard. Pulmonary/Chest: Effort normal and breath sounds normal. No respiratory distress.  Abdominal: Soft. There is no tenderness. There is no rebound and no guarding.  Musculoskeletal: Normal range of motion. He exhibits no edema or tenderness.  Full range of motion of hips without pain  Neurological: He is alert. No cranial nerve deficit.  Moving all extremities, cranial nerves II-12 intact. Follows commands.  Skin: Skin is warm.    ED Course  EPISTAXIS MANAGEMENT Date/Time:  01/11/2015 4:18 PM Performed by: Glynn Octave Authorized by: Glynn Octave Consent: Verbal consent obtained. Risks and benefits: risks, benefits and alternatives were discussed Consent given by: patient and power of attorney Patient understanding: patient states understanding of the procedure being performed Patient identity confirmed: verbally with patient and provided demographic data Anesthesia: local infiltration Local anesthetic: lidocaine 1% without epinephrine and topical anesthetic Patient sedated: no Treatment site: left anterior Repair method: suction Post-procedure assessment: bleeding decreased Treatment complexity: complex Recurrence: recurrence of recent bleed Patient tolerance: Patient tolerated the procedure well with no immediate complications   (including critical care time) Labs Review Labs Reviewed - No data to display  Imaging Review Dg Chest 2 View  01/11/2015   CLINICAL DATA:  Fall  EXAM: CHEST  2 VIEW  COMPARISON:  01/06/2012  FINDINGS: The heart size and mediastinal contours are within normal limits. Both lungs are clear with the exception of minimal crowding of the bronchovascular markings at the lung bases. The visualized skeletal structures are unremarkable. Remote deformity of the left lateral third rib is reidentified.  IMPRESSION: No active cardiopulmonary disease.   Electronically Signed   By: Christiana Pellant M.D.   On: 01/11/2015 12:41   Dg Pelvis 1-2 Views  01/11/2015   CLINICAL DATA:  Recent fall.  Fell forward.  EXAM: PELVIS - 1-2 VIEW  COMPARISON:  08/13/2013  FINDINGS: Pelvic bony ring is intact. No gross abnormality to either hip. Stable focal sclerosis in the right trochanteric region probably represents a bone island. Stool in the rectum.  IMPRESSION: No acute bone abnormality in the pelvis.   Electronically Signed   By: Richarda Overlie M.D.   On: 01/11/2015 12:41   Ct Head Wo Contrast  01/11/2015   CLINICAL DATA:  Larey Seat. Bleeding around the  nose. History of dementia.  EXAM: CT HEAD WITHOUT CONTRAST  CT MAXILLOFACIAL WITHOUT CONTRAST  CT CERVICAL SPINE WITHOUT CONTRAST  TECHNIQUE: Multidetector CT imaging of the head, cervical spine, and maxillofacial structures were performed using the standard protocol without intravenous contrast. Multiplanar CT image reconstructions of the cervical spine and maxillofacial structures were also generated.  COMPARISON:  12/29/2014  FINDINGS: CT HEAD FINDINGS  Stable appearance of the cerebral atrophy. Stable areas of low-density in the white matter. No evidence for acute hemorrhage, mass lesion, midline shift, hydrocephalus or large infarct. The mastoid air cells are clear. Again noted is a small amount of fluid or mucosal thickening in the left maxillary sinus. Mild mucosal thickening in the ethmoid air cells. Evidence for bilateral nasal bone fractures with soft tissue swelling and subcutaneous gas in the nose. In addition, there is fracture of the nasal septum.  CT MAXILLOFACIAL FINDINGS  Evidence for left cataract surgery. Subcutaneous gas and soft tissue swelling in the nose, particularly on the left side. No acute abnormality involving the globes. No acute abnormalities in the visualized intracranial structures. There are comminuted bilateral fractures of the nasal bones. There is also a fracture of the nasal  septum with the apex pointing toward the left. Periapical lucency involving the right upper lateral incisor. Mucosal disease in left maxillary sinus. There is extensive fluid or edema in the nasal cavity. The pterygoid plates are intact. Patient appear has periapical lucency in a left lower tooth which may represent the first bicuspid but uncertain. Mandible and mandibular condyles are intact. Zygomatic arches are intact. Multilevel degenerative changes in cervical spine. Orbital walls are intact.  CT CERVICAL SPINE FINDINGS  Upper lungs are clear without a pneumothorax. Negative for an acute fracture or  dislocation in the cervical spine. There is probably a tracheal diverticulum on the right side. No significant soft tissue swelling in the neck. There is severe facet arthropathy on both sides of the cervical spine. There is severe disc space loss at C5-C6 and C6-C7.  IMPRESSION: No acute intracranial abnormality. Stable atrophy and evidence for chronic small vessel ischemic changes.  Bilateral comminuted nasal bone fractures and fracture of the nasal septum. Soft tissue swelling and probable laceration involving the nose.  Severe degenerative changes in the cervical spine. No acute bone abnormality in the cervical spine.  Question a diverticulum or diverticula involving the right side of the trachea.  Periapical lucency involving teeth as described. This can be a source for dental infection.   Electronically Signed   By: Richarda Overlie M.D.   On: 01/11/2015 13:00   Ct Cervical Spine Wo Contrast  01/11/2015   CLINICAL DATA:  Larey Seat. Bleeding around the nose. History of dementia.  EXAM: CT HEAD WITHOUT CONTRAST  CT MAXILLOFACIAL WITHOUT CONTRAST  CT CERVICAL SPINE WITHOUT CONTRAST  TECHNIQUE: Multidetector CT imaging of the head, cervical spine, and maxillofacial structures were performed using the standard protocol without intravenous contrast. Multiplanar CT image reconstructions of the cervical spine and maxillofacial structures were also generated.  COMPARISON:  12/29/2014  FINDINGS: CT HEAD FINDINGS  Stable appearance of the cerebral atrophy. Stable areas of low-density in the white matter. No evidence for acute hemorrhage, mass lesion, midline shift, hydrocephalus or large infarct. The mastoid air cells are clear. Again noted is a small amount of fluid or mucosal thickening in the left maxillary sinus. Mild mucosal thickening in the ethmoid air cells. Evidence for bilateral nasal bone fractures with soft tissue swelling and subcutaneous gas in the nose. In addition, there is fracture of the nasal septum.  CT  MAXILLOFACIAL FINDINGS  Evidence for left cataract surgery. Subcutaneous gas and soft tissue swelling in the nose, particularly on the left side. No acute abnormality involving the globes. No acute abnormalities in the visualized intracranial structures. There are comminuted bilateral fractures of the nasal bones. There is also a fracture of the nasal septum with the apex pointing toward the left. Periapical lucency involving the right upper lateral incisor. Mucosal disease in left maxillary sinus. There is extensive fluid or edema in the nasal cavity. The pterygoid plates are intact. Patient appear has periapical lucency in a left lower tooth which may represent the first bicuspid but uncertain. Mandible and mandibular condyles are intact. Zygomatic arches are intact. Multilevel degenerative changes in cervical spine. Orbital walls are intact.  CT CERVICAL SPINE FINDINGS  Upper lungs are clear without a pneumothorax. Negative for an acute fracture or dislocation in the cervical spine. There is probably a tracheal diverticulum on the right side. No significant soft tissue swelling in the neck. There is severe facet arthropathy on both sides of the cervical spine. There is severe disc space loss at C5-C6 and C6-C7.  IMPRESSION: No acute intracranial abnormality. Stable atrophy and evidence for chronic small vessel ischemic changes.  Bilateral comminuted nasal bone fractures and fracture of the nasal septum. Soft tissue swelling and probable laceration involving the nose.  Severe degenerative changes in the cervical spine. No acute bone abnormality in the cervical spine.  Question a diverticulum or diverticula involving the right side of the trachea.  Periapical lucency involving teeth as described. This can be a source for dental infection.   Electronically Signed   By: Richarda OverlieAdam  Henn M.D.   On: 01/11/2015 13:00   Ct Maxillofacial Wo Cm  01/11/2015   CLINICAL DATA:  Larey SeatFell. Bleeding around the nose. History of dementia.   EXAM: CT HEAD WITHOUT CONTRAST  CT MAXILLOFACIAL WITHOUT CONTRAST  CT CERVICAL SPINE WITHOUT CONTRAST  TECHNIQUE: Multidetector CT imaging of the head, cervical spine, and maxillofacial structures were performed using the standard protocol without intravenous contrast. Multiplanar CT image reconstructions of the cervical spine and maxillofacial structures were also generated.  COMPARISON:  12/29/2014  FINDINGS: CT HEAD FINDINGS  Stable appearance of the cerebral atrophy. Stable areas of low-density in the white matter. No evidence for acute hemorrhage, mass lesion, midline shift, hydrocephalus or large infarct. The mastoid air cells are clear. Again noted is a small amount of fluid or mucosal thickening in the left maxillary sinus. Mild mucosal thickening in the ethmoid air cells. Evidence for bilateral nasal bone fractures with soft tissue swelling and subcutaneous gas in the nose. In addition, there is fracture of the nasal septum.  CT MAXILLOFACIAL FINDINGS  Evidence for left cataract surgery. Subcutaneous gas and soft tissue swelling in the nose, particularly on the left side. No acute abnormality involving the globes. No acute abnormalities in the visualized intracranial structures. There are comminuted bilateral fractures of the nasal bones. There is also a fracture of the nasal septum with the apex pointing toward the left. Periapical lucency involving the right upper lateral incisor. Mucosal disease in left maxillary sinus. There is extensive fluid or edema in the nasal cavity. The pterygoid plates are intact. Patient appear has periapical lucency in a left lower tooth which may represent the first bicuspid but uncertain. Mandible and mandibular condyles are intact. Zygomatic arches are intact. Multilevel degenerative changes in cervical spine. Orbital walls are intact.  CT CERVICAL SPINE FINDINGS  Upper lungs are clear without a pneumothorax. Negative for an acute fracture or dislocation in the cervical  spine. There is probably a tracheal diverticulum on the right side. No significant soft tissue swelling in the neck. There is severe facet arthropathy on both sides of the cervical spine. There is severe disc space loss at C5-C6 and C6-C7.  IMPRESSION: No acute intracranial abnormality. Stable atrophy and evidence for chronic small vessel ischemic changes.  Bilateral comminuted nasal bone fractures and fracture of the nasal septum. Soft tissue swelling and probable laceration involving the nose.  Severe degenerative changes in the cervical spine. No acute bone abnormality in the cervical spine.  Question a diverticulum or diverticula involving the right side of the trachea.  Periapical lucency involving teeth as described. This can be a source for dental infection.   Electronically Signed   By: Richarda OverlieAdam  Henn M.D.   On: 01/11/2015 13:00     EKG Interpretation None      MDM   Final diagnoses:  Fall, initial encounter  Nasal fracture, open, initial encounter   witnessed mechanical fall with facial trauma. No loss of consciousness. Patient with dementia at baseline mental  status. Neurologically intact.  Patient with blood from bilateral nares, blood in mouth from mucosal surface upper lip laceration.  Imaging remarkable for nasal bone fractures and nasal septal fracture. No C-spine fracture. Head CT negative.  Some oozing persists from left nare. Patient unable to tolerate nasal packing. D/w Dr. Leta Baptist who agrees to see in office on Monday and states can pack as needed.  Patient unable tolerate nasal packing but bleeding has subsided. No blood in oropharynx. Nasal fractures discussed with patient and family. Outpatient follow-up arranged for Monday. Patient is ambulatory at his baseline. Tolerating by mouth without difficulty. Family requesting liquid antibiotics as he sometimes has difficulty with pills. His neurologist has scheduled him for a formal speech evaluation in the near future. Return  precautions discussed.   Glynn Octave, MD 01/11/15 1620

## 2015-01-11 NOTE — ED Notes (Signed)
He has just ambulated with assist with walker.  His family states "He walks as good as he does normally at home".  He has drank some apple juice without difficulty and is now eating apple sauce.

## 2015-01-11 NOTE — ED Notes (Signed)
Bed: WA09 Expected date: 01/11/15 Expected time: 10:51 AM Means of arrival: Ambulance Comments: Fall/ KED

## 2015-01-11 NOTE — Discharge Instructions (Signed)
Facial Fracture Follow-up with the ear nose and throat doctor on Monday. Take the antibiotics as prescribed. Return to the ED with worsening symptoms A facial fracture is a break in one of the bones of your face. HOME CARE INSTRUCTIONS   Protect the injured part of your face until it is healed.  Do not participate in activities which give chance for re-injury until your doctor approves.  Gently wash and dry your face.  Wear head and facial protection while riding a bicycle, motorcycle, or snowmobile. SEEK MEDICAL CARE IF:   An oral temperature above 102 F (38.9 C) develops.  You have severe headaches or notice changes in your vision.  You have new numbness or tingling in your face.  You develop nausea (feeling sick to your stomach), vomiting or a stiff neck. SEEK IMMEDIATE MEDICAL CARE IF:   You develop difficulty seeing or experience double vision.  You become dizzy, lightheaded, or faint.  You develop trouble speaking, breathing, or swallowing.  You have a watery discharge from your nose or ear. MAKE SURE YOU:   Understand these instructions.  Will watch your condition.  Will get help right away if you are not doing well or get worse. Document Released: 10/04/2005 Document Revised: 12/27/2011 Document Reviewed: 05/23/2008 [facility] Patient Information 2015 [place] [place]. This information is not intended to replace advice given to you by your health care provider. Make sure you discuss any questions you have with your health care provider.

## 2015-01-11 NOTE — ED Provider Notes (Signed)
LACERATION REPAIR Performed by: Carlyle DollyLAWYER,Giuseppe Duchemin W Authorized by: Carlyle DollyLAWYER,Zellie Jenning W Consent: Verbal consent obtained. Risks and benefits: risks, benefits and alternatives were discussed Consent given by: patient Patient identity confirmed: provided demographic data Prepped and Draped in normal sterile fashion Wound explored  Laceration Location: between his eyebrows  Laceration Length: 2 cm  No Foreign Bodies seen or palpated  Anesthesia: N/A  Local anesthetic:N/A  Anesthetic total:N/A  Irrigation method: syringe Amount of cleaning: standard  Skin closure: Dermabond  Number of sutures: Dermabond  Technique: Dermabond  Patient tolerance: Patient tolerated the procedure well with no immediate complications.   William NightChristopher Lorena Benham, PA-C 01/11/15 1618  Glynn OctaveStephen Rancour, MD 01/11/15 [phone]

## 2015-01-13 ENCOUNTER — Other Ambulatory Visit: Payer: Self-pay | Admitting: *Deleted

## 2015-01-13 ENCOUNTER — Telehealth: Payer: Self-pay | Admitting: Neurology

## 2015-01-13 MED ORDER — HYDROXYZINE HCL 10 MG PO TABS: 10.0000 mg | ORAL_TABLET | Freq: Every day | ORAL | Status: AC

## 2015-01-13 NOTE — Telephone Encounter (Signed)
Per vo by Dr. Epimenio FootSater, rx for hydroxyzine 10mg , one tab qhs sent to the pharmacy.  Family agreeable to new medication.

## 2015-01-13 NOTE — Telephone Encounter (Signed)
Patient's daughter in law stated patient has falling twice and has broken his nose.  He will not use the walker when he's walking and questioning would could be done.  Please call and advise.

## 2015-01-15 ENCOUNTER — Telehealth: Payer: Self-pay | Admitting: Neurology

## 2015-01-15 NOTE — Telephone Encounter (Signed)
[facility] stating [facility] needs a copy of the pt's medication list for his admission visit.  She is going to see the pt tomorrow so she needs this as soon as possible.  Her fax number [phone].  Please send Attn: Revonda StandardAllison.

## 2015-01-15 NOTE — Telephone Encounter (Signed)
Called Revonda Standardllison to let her know med list has been faxed.

## 2015-01-27 DIAGNOSIS — R269 Unspecified abnormalities of gait and mobility: Secondary | ICD-10-CM

## 2015-02-27 ENCOUNTER — Telehealth: Payer: Self-pay | Admitting: Neurology

## 2015-02-27 NOTE — Telephone Encounter (Signed)
Darnelle Bevins from [facility] called requesting a verbal order for in home physical therapy to extant 2 times for for 2 weeks. Please call and advise. Darnelle can be reached @ [phone]

## 2015-02-27 NOTE — Telephone Encounter (Signed)
Darnelle aware that it is okay to extend PT per patient's needs.

## 2015-02-28 DIAGNOSIS — Z0289 Encounter for other administrative examinations: Secondary | ICD-10-CM

## 2015-03-03 ENCOUNTER — Telehealth: Payer: Self-pay | Admitting: *Deleted

## 2015-03-03 NOTE — Telephone Encounter (Signed)
The patient form on William Dougherty desk.

## 2015-03-06 ENCOUNTER — Telehealth: Payer: Self-pay | Admitting: *Deleted

## 2015-03-06 NOTE — Telephone Encounter (Signed)
Patient picked up his form on 03/05/15.

## 2015-03-10 ENCOUNTER — Telehealth: Payer: Self-pay | Admitting: Neurology

## 2015-03-10 NOTE — Telephone Encounter (Signed)
Darnell Bevins-Physical Therapist w/ [facility] called to report that the pt fell 03/09/15. His son said he was putting on his own shoes when his father ran outside and tried to get in the car falling in the driveway. Pt is not injured. Woodroe ChenDarnell Bevins can be reached at [phone].

## 2015-03-10 NOTE — Telephone Encounter (Signed)
FYI

## 2015-03-19 ENCOUNTER — Ambulatory Visit (INDEPENDENT_AMBULATORY_CARE_PROVIDER_SITE_OTHER): Payer: Medicare Other | Admitting: Podiatry

## 2015-03-19 ENCOUNTER — Encounter: Payer: Self-pay | Admitting: Podiatry

## 2015-03-19 DIAGNOSIS — M79676 Pain in unspecified toe(s): Secondary | ICD-10-CM

## 2015-03-19 DIAGNOSIS — B351 Tinea unguium: Secondary | ICD-10-CM

## 2015-03-19 NOTE — Progress Notes (Signed)
Patient ID: William Dougherty, male   DOB: 02/15/1926, 81 y.o.   MRN: [id number]  Subjective: This patient presents with his son who is requesting debridement of his father's toenails, with a history of discomfort in the toenails  Objective: Patient appears confused The toenails are elongated, incurvated, discolored, hypertrophic and tender to direct palpation 6-10  Assessment: Symptomatic onychomycoses 6-10  Plan: Debridement of toenails 10 without any bleeding  Reappoint 3 months:

## 2015-04-14 ENCOUNTER — Other Ambulatory Visit: Payer: Self-pay

## 2015-04-28 ENCOUNTER — Ambulatory Visit (INDEPENDENT_AMBULATORY_CARE_PROVIDER_SITE_OTHER): Payer: Medicare Other | Admitting: Nurse Practitioner

## 2015-04-28 ENCOUNTER — Encounter: Payer: Self-pay | Admitting: Nurse Practitioner

## 2015-04-28 VITALS — BP 126/67 | HR 62 | Ht 64.0 in | Wt 120.0 lb

## 2015-04-28 DIAGNOSIS — F0390 Unspecified dementia without behavioral disturbance: Secondary | ICD-10-CM

## 2015-04-28 DIAGNOSIS — E538 Deficiency of other specified B group vitamins: Secondary | ICD-10-CM | POA: Diagnosis not present

## 2015-04-28 MED ORDER — MEMANTINE HCL 5 MG PO TABS: 5.0000 mg | ORAL_TABLET | Freq: Two times a day (BID) | ORAL | Status: DC

## 2015-04-28 NOTE — Progress Notes (Signed)
[facility]  PATIENT: William Dougherty DOB: [birth year]   REASON FOR VISIT: Follow-up for memory loss  HISTORY FROM: Son    HISTORY OF PRESENT ILLNESS:[name] was referred by his primary care physician Dr. Maury Dus for evaluation of gradual onset memory trouble. He is a retired Chief Financial Officer, has past medical history of hypothyroidism, on supplement, hypertension. He lives with his son's family, since 2013, he had gradual onset memory loss, he was noted to repeat himself, become very forgetful, one hour after eating breakfast, he would forget that he has eaten breakfast, he spent most of his day watching TV, not interested in any activities,  He has gait trouble, walking with a cane, he complains of bilateral knee pain, denies significant low back pain, over the past 6 months, he has worsening memory trouble, also developed urinary incontinence, He wet himself himself on his way to the bathroom. He denies bowel incontinence  In 2008, he had confusion, hyperactivity, calling people at night, he was treated with seroqueal, he became sleepy all the time,  MRI of the brain in Nov 2014: moderate to severe atrophy, mild small vessel disease, laboratory showed a normal ESR, C-reactive protein, vitamin B12, methylmalonic acid level.  X-ray of bilateral hip showed no significant abnormality, x-ray of bilateral knee showed degenerative arthritis, with evidence of effusion.  He continued to have gait difficulty. he denies low back pain, he needs prompt to take a bath, using bathroom, otherwise he would make accident,  He had neuropsych testing with Dr. Valentina Shaggy in 2015, This revealed" moderate dementia most likely of the Alzheimer's type. There were no signs of psychiatric behavioral dysfunction. He is not depressed but rather lack of initiative that can be associated with dementia. He does not possess sufficient mental capacity to make decisions on his own". The son reports that  he needs assistance with bathing and toileting. He can feed himself once his food is prepared and he has a good appetite. He is sleeping well without any wandering behavior. There have been no safety concerns identified. Mr. Hair lives with his son. He has been granted 4 hours of care 7 days a week by Rande Brunt  UPDATE March 2nd 2016: He continues to decline steadily, he is sedentary, sititng down most of times, he is more confused, needing helps at his daily activity, including toileting, bathing, increased gait difficulty, fell few times, He sleeps right after dinner, sometimes woke up in the middle of the night, he also has more difficulty swallowing, talking, and HE is no longer taking his Namenda is not correct. I discussed UPDATE 04/28/15 Mr. Helm returns with his son. He continues to have a steady decline, needs help with activities of daily living including toileting and bathing increased gait difficulty, had a fall and fractured his nose since last seen. Appetite is good he continues to lose weight, down 3 pounds since last seen in March. Family has attempted to get in home services however sometimes the caregivers are unreliable and do not show up. His pills are crushed and taken with food. He has not taken his Namenda only took a few doses when last seen by Dr. Krista Blue due to drowsiness.  He returns for reevaluation. He had some physical therapy that was helpful but he apparently reached his maximum potential.  REVIEW OF SYSTEMS: Full 14 system review of systems performed and notable only for those listed, all others are neg:  Constitutional: neg  Cardiovascular: neg Ear/Nose/Throat: neg  Skin: neg Eyes: neg  Respiratory: neg Gastroitestinal: Constipation Hematology/Lymphatic: neg  Endocrine: neg Musculoskeletal:neg Allergy/Immunology: neg Neurological: Memory loss, speech difficulty Psychiatric: Confusion Sleep : neg   ALLERGIES: Allergies  Allergen Reactions  . Eggs Or Egg-Derived  Products Diarrhea and Nausea Only    HOME MEDICATIONS: Outpatient Prescriptions Prior to Visit  Medication Sig Dispense Refill  . acetaminophen (TYLENOL) 500 MG tablet Take 2 tablets (1,000 mg total) by mouth every 6 (six) hours as needed. 30 tablet 0  . dutasteride (AVODART) 0.5 MG capsule Take 0.5 mg by mouth daily.    Marland Kitchen levothyroxine (SYNTHROID, LEVOTHROID) 50 MCG tablet Take 50 mcg by mouth daily before breakfast.    . losartan-hydrochlorothiazide (HYZAAR) 50-12.5 MG per tablet Take 1 tablet by mouth every morning.    . Multiple Vitamins-Minerals (MULTIVITAMIN & MINERAL PO) Take 1 tablet by mouth daily.    . travoprost, benzalkonium, (TRAVATAN) 0.004 % ophthalmic solution Place 1 drop into both eyes at bedtime.     . donepezil (ARICEPT) 5 MG tablet Take 1 tablet (5 mg total) by mouth daily. $RemoveBefo'5mg'IQEtRESisvk$  po daily for 1 month then increase to 2 tabs daily (Patient not taking: Reported on 04/28/2015) 60 tablet 6  . finasteride (PROSCAR) 5 MG tablet Taking avodart at this time    . hydrOXYzine (ATARAX/VISTARIL) 10 MG tablet Take 1 tablet (10 mg total) by mouth at bedtime. (Patient not taking: Reported on 04/28/2015) 30 tablet 0  . memantine (NAMENDA) 10 MG tablet Take 1 tablet (10 mg total) by mouth 2 (two) times daily. (Patient not taking: Reported on 04/28/2015) 60 tablet 11   No facility-administered medications prior to visit.    PAST MEDICAL HISTORY: Past Medical History  Diagnosis Date  . Hypertension   . Hypothyroidism   . BPH (benign prostatic hyperplasia)   . Glaucoma   . GERD (gastroesophageal reflux disease)   . Knee pain, bilateral   . Bipolar 1 disorder   . Hyperpigmentation     left maxilla    PAST SURGICAL HISTORY: Past Surgical History  Procedure Laterality Date  . None      FAMILY HISTORY: Family History  Problem Relation Age of Onset  . Hypothyroidism Son   . Vitiligo Son   . Hashimoto's thyroiditis Grandchild     SOCIAL HISTORY: History   Social History  .  Marital Status: Married    Spouse Name: Laxmiben  . Number of Children: 3  . Years of Education: college   Occupational History  .      retired   Social History Main Topics  . Smoking status: Former Smoker    Quit date: 08/10/1993  . Smokeless tobacco: Never Used  . Alcohol Use: No  . Drug Use: No  . Sexual Activity: Not on file   Other Topics Concern  . Not on file   Social History Narrative   Patient is married. (Laxmiben)   Patient lives with  Son Merrie Roof and his wife Lilyan Punt)   Patient is a retired Chief Financial Officer.   Patient has 3 adult children.   Right handed.   Tea with milk.              PHYSICAL EXAM  Filed Vitals:   04/28/15 1105  BP: 126/67  Pulse: 62  Height: $Remove'5\' 4"'OuOolhc$  (1.626 m)  Weight: 120 lb (54.432 kg)   Body mass index is 20.59 kg/(m^2).  Generalized: Well developed, in no acute distress  Head: normocephalic and atraumatic,. Oropharynx benign  Neck: Supple, no carotid bruits  Musculoskeletal: No  deformity   Neurological examination   Mentation: Alert , relies on son to provide history . Follows some  commands. MMSE 17/30. AFT 4. Clock drawing 2/4.    Cranial nerve II-XII: Pupils were equal round reactive to light extraocular movements were full, visual field were full on confrontational test. Facial sensation and strength were normal. hearing was intact to finger rubbing bilaterally. Uvula tongue midline. head turning and shoulder shrug were normal and symmetric.Tongue protrusion into cheek strength was normal. Motor: normal bulk and tone, full strength in the BUE, BLE, fine finger movements normal, no pronator drift. No focal weakness Sensory not reliable Coordination: finger-nose-finger,  no dysmetria unable to perform heel to shin Reflexes: Brachioradialis 2/2, biceps 2/2, triceps 2/2, patellar 2/2, Achilles 2/2, plantar responses were flexor bilaterally. Gait and Station: Rising up from seated position without assistance, push off, dragging both legs  right worse than left, ambulates with rolling walker  DIAGNOSTIC DATA (LABS, IMAGING, TESTING) -  ASSESSMENT AND PLAN  81 y.o. year old male  has a past medical history of Hypertension; Hypothyroidism; BPH (benign prostatic hyperplasia); gradual onset memory trouble most consistent with Alzheimer's dementia. Neuropsych testing revealed moderate dementia most likely of the Alzheimer's type. He has continued to have a steady decline needing more help with activities of daily living, wearing depends now for bowel and bladder incontinence, recent fall with fractured nose, unsteady gait, dysarthria and dysphagia.  Restart Namenda 5 mg twice daily slow titration Given information on comfort keepers in home services I spent additional 15 minutes in total face to face time with the son more than 50% of which was spent counseling and coordination of care, reviewing test results reviewing medications and discussing and reviewing the diagnosis of dementia and further treatment options. May need to think about placement particularly since patient cannot be left alone and there has been inconsistent caregivers from agency personal.  Return in 4-6 months Vst time 30 min Dennie Bible, [facility], [facility], [facility] [address], [place], [place] [address] [phone]

## 2015-04-28 NOTE — Patient Instructions (Signed)
Restart Namenda 5 mg twice daily Given information on comfort keepers Return in 4-6 months

## 2015-04-28 NOTE — Progress Notes (Signed)
I have reviewed and agreed above plan. 

## 2015-05-08 ENCOUNTER — Emergency Department (HOSPITAL_COMMUNITY)
Admission: EM | Admit: 2015-05-08 | Discharge: 2015-05-09 | Disposition: A | Discharge status: Home / Self Care | Payer: Medicare Other | Attending: Emergency Medicine | Admitting: Emergency Medicine

## 2015-05-08 ENCOUNTER — Emergency Department (HOSPITAL_COMMUNITY): Payer: Medicare Other

## 2015-05-08 ENCOUNTER — Encounter (HOSPITAL_COMMUNITY): Payer: Self-pay

## 2015-05-08 DIAGNOSIS — H409 Unspecified glaucoma: Secondary | ICD-10-CM | POA: Diagnosis not present

## 2015-05-08 DIAGNOSIS — Z79899 Other long term (current) drug therapy: Secondary | ICD-10-CM | POA: Insufficient documentation

## 2015-05-08 DIAGNOSIS — F0390 Unspecified dementia without behavioral disturbance: Secondary | ICD-10-CM | POA: Insufficient documentation

## 2015-05-08 DIAGNOSIS — Z872 Personal history of diseases of the skin and subcutaneous tissue: Secondary | ICD-10-CM | POA: Diagnosis not present

## 2015-05-08 DIAGNOSIS — E039 Hypothyroidism, unspecified: Secondary | ICD-10-CM | POA: Insufficient documentation

## 2015-05-08 DIAGNOSIS — I10 Essential (primary) hypertension: Secondary | ICD-10-CM | POA: Insufficient documentation

## 2015-05-08 DIAGNOSIS — Z87891 Personal history of nicotine dependence: Secondary | ICD-10-CM | POA: Insufficient documentation

## 2015-05-08 DIAGNOSIS — N400 Enlarged prostate without lower urinary tract symptoms: Secondary | ICD-10-CM | POA: Insufficient documentation

## 2015-05-08 DIAGNOSIS — K5900 Constipation, unspecified: Secondary | ICD-10-CM

## 2015-05-08 HISTORY — DX: Unspecified dementia, unspecified severity, without behavioral disturbance, psychotic disturbance, mood disturbance, and anxiety: F03.90

## 2015-05-08 LAB — BASIC METABOLIC PANEL
Anion gap: 6 (ref 5–15)
BUN: 24 mg/dL — ABNORMAL HIGH (ref 6–20)
CO2: 27 mmol/L (ref 22–32)
Calcium: 9.5 mg/dL (ref 8.9–10.3)
Chloride: 103 mmol/L (ref 101–111)
Creatinine, Ser: 1.22 mg/dL (ref 0.61–1.24)
GFR calc non Af Amer: 51 mL/min — ABNORMAL LOW (ref >60)
GFR, EST AFRICAN AMERICAN: 60 mL/min — AB (ref >60)
Glucose, Bld: 110 mg/dL — ABNORMAL HIGH (ref 65–99)
POTASSIUM: 4.1 mmol/L (ref 3.5–5.1)
Sodium: 136 mmol/L (ref 135–145)

## 2015-05-08 LAB — CBC WITH DIFFERENTIAL/PLATELET
Basophils Absolute: 0 10*3/uL (ref 0.0–0.1)
Basophils Relative: 1 % (ref 0–1)
EOS PCT: 3 % (ref 0–5)
Eosinophils Absolute: 0.2 10*3/uL (ref 0.0–0.7)
HCT: 35.2 % — ABNORMAL LOW (ref 39.0–52.0)
HEMOGLOBIN: 11.8 g/dL — AB (ref 13.0–17.0)
Lymphocytes Relative: 28 % (ref 12–46)
Lymphs Abs: 1.9 10*3/uL (ref 0.7–4.0)
MCH: 30.7 pg (ref 26.0–34.0)
MCHC: 33.5 g/dL (ref 30.0–36.0)
MCV: 91.7 fL (ref 78.0–100.0)
Monocytes Absolute: 0.6 10*3/uL (ref 0.1–1.0)
Monocytes Relative: 9 % (ref 3–12)
Neutro Abs: 4 10*3/uL (ref 1.7–7.7)
Neutrophils Relative %: 59 % (ref 43–77)
Platelets: 221 10*3/uL (ref 150–400)
RBC: 3.84 MIL/uL — ABNORMAL LOW (ref 4.22–5.81)
RDW: 13.5 % (ref 11.5–15.5)
WBC: 6.6 10*3/uL (ref 4.0–10.5)

## 2015-05-08 MED ORDER — FLEET ENEMA 7-19 GM/118ML RE ENEM
1.0000 | ENEMA | Freq: Once | RECTAL | Status: AC
  Administered 2015-05-09: 1 via RECTAL
  Filled 2015-05-08: qty 1

## 2015-05-08 NOTE — ED Notes (Signed)
Delay in lab draw, pt not in room 

## 2015-05-08 NOTE — ED Provider Notes (Signed)
CSN: [id number]     Arrival date & time 05/08/15  2144 History   First MD Initiated Contact with Patient 05/08/15 2203     Chief Complaint  Patient presents with  . Constipation     (Consider location/radiation/quality/duration/timing/severity/associated sxs/prior Treatment) HPI Comments: William Dougherty, 81 y/o male with dementia, incontinence, and hypothyroidism presents with constipation. All history was provided by family members due to the patient's advanced dementia. His last BM was 8 days ago. He has had intermittent constipation before that only lasted 4 days. They have tried fiber supplements, laxatives, miralax, and stool softeners with no relief. The only changes in behavior the family has noticed is possible increase in lethargy. Patient's family denies discharge or blood out of rectum.  Patient is a 81 y.o. male presenting with constipation. The history is provided by a relative. No language interpreter was used.  Constipation Severity:  Moderate Time since last bowel movement:  8 days Timing:  Constant Progression:  Unchanged Chronicity:  Recurrent Context: not dehydration, not dietary changes, not medication, not narcotics and not stress   Stool description:  None produced Unusual stool frequency:  Daily Relieved by:  Nothing Worsened by:  Nothing tried Ineffective treatments:  Diet changes, fiber, laxatives, Miralax and stool softeners Associated symptoms: no abdominal pain, no back pain, no diarrhea, no flatus, no hematochezia, no nausea and no vomiting   Risk factors: no change in medication, no obesity, no recent illness and no recent surgery     Past Medical History  Diagnosis Date  . Hypertension   . Hypothyroidism   . BPH (benign prostatic hyperplasia)   . Glaucoma   . GERD (gastroesophageal reflux disease)   . Knee pain, bilateral   . Bipolar 1 disorder   . Hyperpigmentation     left maxilla  . Dementia    Past Surgical History  Procedure Laterality  Date  . None     Family History  Problem Relation Age of Onset  . Hypothyroidism Son   . Vitiligo Son   . Hashimoto's thyroiditis Grandchild    History  Substance Use Topics  . Smoking status: Former Smoker    Quit date: 08/10/1993  . Smokeless tobacco: Never Used  . Alcohol Use: No    Review of Systems  Gastrointestinal: Positive for constipation. Negative for nausea, vomiting, abdominal pain, diarrhea, hematochezia and flatus.  Musculoskeletal: Negative for back pain.  All other systems reviewed and are negative.     Allergies  Eggs or egg-derived products  Home Medications   Prior to Admission medications   Medication Sig Start Date End Date Taking? Authorizing Provider  Docusate Calcium (STOOL SOFTENER PO) Take 1 tablet by mouth daily as needed (constipation).   Yes Historical Provider, MD  dutasteride (AVODART) 0.5 MG capsule Take 0.5 mg by mouth daily.   Yes Historical Provider, MD  levothyroxine (SYNTHROID, LEVOTHROID) 50 MCG tablet Take 50 mcg by mouth daily before breakfast.   Yes Historical Provider, MD  losartan-hydrochlorothiazide (HYZAAR) 50-12.5 MG per tablet Take 1 tablet by mouth every morning.   Yes Historical Provider, MD  memantine (NAMENDA) 5 MG tablet Take 1 tablet (5 mg total) by mouth 2 (two) times daily. For 1 month then 2 tabs twice daily Patient taking differently: Take 5 mg by mouth daily. For 1 month then 2 tabs twice daily 04/28/15  Yes Nilda Riggs, NP  polyethylene glycol [facility] / GLYCOLAX) packet Take 17 g by mouth daily as needed for mild constipation.   Yes  Historical Provider, MD  travoprost, benzalkonium, (TRAVATAN) 0.004 % ophthalmic solution Place 1 drop into both eyes at bedtime.    Yes Historical Provider, MD  acetaminophen (TYLENOL) 500 MG tablet Take 2 tablets (1,000 mg total) by mouth every 6 (six) hours as needed. 12/29/14   Arby Barrette, MD  hydrOXYzine (ATARAX/VISTARIL) 10 MG tablet Take 1 tablet (10 mg total) by mouth  at bedtime. Patient not taking: Reported on 04/28/2015 01/13/15   Asa Lente, MD   BP 141/72 mmHg  Pulse 75  Temp(Src) 98.2 F (36.8 C) (Oral)  Resp 18  SpO2 98% Physical Exam  Constitutional: He is oriented to person, place, and time. He appears well-developed and well-nourished. No distress.  HENT:  Head: Normocephalic and atraumatic.  Eyes: Conjunctivae are normal.  Neck: Normal range of motion. Neck supple.  Cardiovascular: Normal rate, regular rhythm and intact distal pulses.  Exam reveals no gallop and no friction rub.   No murmur heard. Pulmonary/Chest: Effort normal and breath sounds normal. He has no wheezes. He has no rales. He exhibits no tenderness.  Abdominal: Soft. Bowel sounds are normal. He exhibits no distension. There is no tenderness. There is no rebound and no guarding.  Genitourinary: Rectum normal.  No stool per rectum  Musculoskeletal: Normal range of motion. He exhibits no edema.  Neurological: He is alert and oriented to person, place, and time. Coordination normal.  Speech is goal-oriented. Moves limbs without ataxia.   Skin: Skin is warm and dry.  Psychiatric: He has a normal mood and affect. His behavior is normal.  Nursing note and vitals reviewed.   ED Course  Procedures (including critical care time) Labs Review Labs Reviewed  CBC WITH DIFFERENTIAL/PLATELET - Abnormal; Notable for the following:    RBC 3.84 (*)    Hemoglobin 11.8 (*)    HCT 35.2 (*)    All other components within normal limits  BASIC METABOLIC PANEL - Abnormal; Notable for the following:    Glucose, Bld 110 (*)    BUN 24 (*)    GFR calc non Af Amer 51 (*)    GFR calc Af Amer 60 (*)    All other components within normal limits    Imaging Review Dg Abd Acute W/chest  05/08/2015   CLINICAL DATA:  Constipation  EXAM: DG ABDOMEN ACUTE W/ 1V CHEST  COMPARISON:  01/11/2015  FINDINGS: Cardiomediastinal silhouette is stable. No acute infiltrate or pleural effusion. No pulmonary  edema. There is nonspecific nonobstructive bowel gas pattern. Abundant stool noted in right colon. Moderate stool noted in splenic flexure of the colon. Abundant stool noted in distal sigmoid colon and rectum. No free abdominal air.  IMPRESSION: No acute disease. Colonic stool as described above. No free abdominal air.   Electronically Signed   By: Natasha Mead M.D.   On: 05/08/2015 22:57     EKG Interpretation None      MDM   Final diagnoses:  Constipation    12:27 AM Labs unremarkable for acute changes. Patient passed a large stool after fleet enema. Vitals stable and patient afebrile. Patient will be discharged with recommended PCP follow up.    Emilia Beck, PA-C 05/09/15 0034  Tilden Fossa, MD 05/09/15 [phone]

## 2015-05-08 NOTE — ED Notes (Addendum)
Patient has dementia.  Family reports that he has not had a bowel movement in one week, despite several medications/ foods to assist with a BM.  Family reports he that he has been passing gas.  Family denies N/V or change in appetite.

## 2015-05-09 DIAGNOSIS — K5900 Constipation, unspecified: Secondary | ICD-10-CM | POA: Diagnosis not present

## 2015-05-09 MED ORDER — MILK AND MOLASSES ENEMA: 1.0000 | Freq: Once | RECTAL | Status: DC

## 2015-05-09 NOTE — Discharge Instructions (Signed)
Follow up with your doctor as needed. Return to the ED with worsening or concerning symptoms.  °

## 2015-06-17 ENCOUNTER — Ambulatory Visit (INDEPENDENT_AMBULATORY_CARE_PROVIDER_SITE_OTHER): Payer: Medicare Other | Admitting: Podiatry

## 2015-06-17 ENCOUNTER — Encounter: Payer: Self-pay | Admitting: Podiatry

## 2015-06-17 DIAGNOSIS — B351 Tinea unguium: Secondary | ICD-10-CM

## 2015-06-17 DIAGNOSIS — M79676 Pain in unspecified toe(s): Secondary | ICD-10-CM

## 2015-06-17 NOTE — Progress Notes (Signed)
Patient ID: William Dougherty, male   DOB: 1926-07-20, 81 y.o.   MRN: [id number]  Subjective: This patient presents with his son who is requesting debridement of his father's toenails. Patient has a history of dementia  Objective: Patient appears confused nonresponsive The toenails are incurvated, discolored, hypertrophic, incurvated and tender to direct palpation 6-10  Assessment: Symptomatic onychomycoses 6-10  Plan: Debridement of toenails 10 mechanically embolectomy without any bleeding  Reappoint 3 month

## 2015-08-28 ENCOUNTER — Ambulatory Visit (INDEPENDENT_AMBULATORY_CARE_PROVIDER_SITE_OTHER): Payer: Medicare Other | Admitting: Neurology

## 2015-08-28 ENCOUNTER — Encounter: Payer: Self-pay | Admitting: Neurology

## 2015-08-28 VITALS — BP 128/66 | HR 62 | Ht 64.0 in | Wt 114.0 lb

## 2015-08-28 DIAGNOSIS — R269 Unspecified abnormalities of gait and mobility: Secondary | ICD-10-CM

## 2015-08-28 DIAGNOSIS — F0390 Unspecified dementia without behavioral disturbance: Secondary | ICD-10-CM

## 2015-08-28 DIAGNOSIS — K117 Disturbances of salivary secretion: Secondary | ICD-10-CM

## 2015-08-28 NOTE — Progress Notes (Signed)
Chief Complaint  Patient presents with  . Dementia    MMSE 14/30.  He is here with his son and his daughter-in-law, who feels his memory is about the same.  His drooling has increased.  He has fallen several times. He is now using a rolling walker all the time.  His speech has worsened and she feels he is having more difficulty swallowing.     [facility]  PATIENT: William Dougherty DOB: 1925-12-04   REASON FOR VISIT: Follow-up for memory loss  HISTORY FROM: Son    HISTORY OF PRESENT ILLNESS:William Dougherty was referred by his primary care physician William Dougherty for evaluation of gradual onset memory trouble.  He is a retired Chief Financial Officer, has past medical history of hypothyroidism, on supplement, hypertension. He lives with his son's family, since 2013, he had gradual onset memory loss, he was noted to repeat himself, become very forgetful, one hour after eating breakfast, he would forget that he has eaten breakfast, he spent most of his day watching TV, not interested in any activities,  He has gait trouble, walking with a cane, he complains of bilateral knee pain, denies significant low back pain, over the past 6 months, he has worsening memory trouble, also developed urinary incontinence, He wet himself himself on his way to the bathroom. He denies bowel incontinence  In 2008, he had confusion, hyperactivity, calling people at night, he was treated with seroqueal, he became sleepy all the time,   MRI of the brain in Nov 2014: moderate to severe atrophy, mild small vessel disease, laboratory showed a normal ESR, C-reactive protein, vitamin B12, methylmalonic acid level.  X-ray of bilateral hip showed no significant abnormality, x-ray of bilateral knee showed degenerative arthritis, with evidence of effusion.  He continued to have gait difficulty. he denies low back pain, he needs prompt to take a bath, using bathroom, otherwise he would make accident,  He had neuropsych  testing with William Dougherty in 2015, This revealed" moderate dementia most likely of the Alzheimer's type. There were no signs of psychiatric behavioral dysfunction. He is not depressed but rather lack of initiative that can be associated with dementia. He does not possess sufficient mental capacity to make decisions on his own". The son reports that he needs assistance with bathing and toileting. He can feed himself once his food is prepared and he has a good appetite. He is sleeping well without any wandering behavior. There have been no safety concerns identified. William Dougherty lives with his son. He has been granted 4 hours of care 7 days a week by William Dougherty  UPDATE March 2nd 2016: He continues to decline steadily, he is sedentary, sititng down most of times, he is more confused, needing helps at his daily activity, including toileting, bathing, increased gait difficulty, fell few times, He sleeps right after dinner, sometimes woke up in the middle of the night, he also has more difficulty swallowing, talking, and HE is no longer taking his Namenda  UPDATE 04/28/15 William Dougherty returns with his son. He continues to have a steady decline, needs help with activities of daily living including toileting and bathing increased gait difficulty, had a fall and fractured his nose since last seen. Appetite is good he continues to lose weight, down 3 pounds since last seen in March. Family has attempted to get in home services however sometimes the caregivers are unreliable and do not show up. His pills are crushed and taken with food.   UPDATE Aug 28 2015:  He is with his son and daughter in law, he could not tolerate his denture, he is now taking blended food. With severe dysarthria, some dysphagia, especially with thin liquid, He has increased swallowing difficulty, excessive drooling, increased gait difficulty, no significant agitations, but needing help in his daily activities, such as bathing, dressing, feeding, ambulate  with a walker  REVIEW OF SYSTEMS: Full 14 system review of systems performed and notable only for those listed, all others are neg: As above ALLERGIES: Allergies  Allergen Reactions  . Eggs Or Egg-Derived Products Diarrhea and Nausea Only    HOME MEDICATIONS: Outpatient Prescriptions Prior to Visit  Medication Sig Dispense Refill  . acetaminophen (TYLENOL) 500 MG tablet Take 2 tablets (1,000 mg total) by mouth every 6 (six) hours as needed. 30 tablet 0  . Docusate Calcium (STOOL SOFTENER PO) Take 1 tablet by mouth daily as needed (constipation).    Marland Kitchen dutasteride (AVODART) 0.5 MG capsule Take 0.5 mg by mouth daily.    . hydrOXYzine (ATARAX/VISTARIL) 10 MG tablet Take 1 tablet (10 mg total) by mouth at bedtime. 30 tablet 0  . levothyroxine (SYNTHROID, LEVOTHROID) 50 MCG tablet Take 50 mcg by mouth daily before breakfast.    . losartan-hydrochlorothiazide (HYZAAR) 50-12.5 MG per tablet Take 1 tablet by mouth every morning.    . memantine (NAMENDA) 5 MG tablet Take 1 tablet (5 mg total) by mouth 2 (two) times daily. For 1 month then 2 tabs twice daily (Patient taking differently: Take 5 mg by mouth daily. For 1 month then 2 tabs twice daily) 120 tablet 4  . polyethylene glycol (MIRALAX / GLYCOLAX) packet Take 17 g by mouth daily as needed for mild constipation.    . travoprost, benzalkonium, (TRAVATAN) 0.004 % ophthalmic solution Place 1 drop into both eyes at bedtime.      No facility-administered medications prior to visit.    PAST MEDICAL HISTORY: Past Medical History  Diagnosis Date  . Hypertension   . Hypothyroidism   . BPH (benign prostatic hyperplasia)   . Glaucoma   . GERD (gastroesophageal reflux disease)   . Knee pain, bilateral   . Bipolar 1 disorder ([place])   . Hyperpigmentation     left maxilla  . Dementia     PAST SURGICAL HISTORY: Past Surgical History  Procedure Laterality Date  . None      FAMILY HISTORY: Family History  Problem Relation Age of Onset  .  Hypothyroidism Son   . Vitiligo Son   . Hashimoto's thyroiditis Grandchild     SOCIAL HISTORY: Social History   Social History  . Marital Status: Married    Spouse Name: Laxmiben  . Number of Children: 3  . Years of Education: college   Occupational History  .      retired   Social History Main Topics  . Smoking status: Former Smoker    Quit date: 08/10/1993  . Smokeless tobacco: Never Used  . Alcohol Use: No  . Drug Use: No  . Sexual Activity: Not on file   Other Topics Concern  . Not on file   Social History Narrative   Patient is married. (Laxmiben)   Patient lives with  Son Merrie Roof and his wife Lilyan Punt)   Patient is a retired Chief Financial Officer.   Patient has 3 adult children.   Right handed.   Tea with milk.              PHYSICAL EXAM  Filed Vitals:   08/28/15 1113  BP: 128/66  Pulse: 62  Height: _0  (1.626 m)  Weight: 114 lb (51.71 kg)   Body mass index is 19.56 kg/(m^2).   PHYSICAL EXAMNIATION:  Gen: NAD, conversant, well nourised, obese, well groomed                     Cardiovascular: Regular rate rhythm, no peripheral edema, warm, nontender. Eyes: Conjunctivae clear without exudates or hemorrhage Neck: Supple, no carotid bruise. Pulmonary: Clear to auscultation bilaterally, week air movement at the base of his lungs  NEUROLOGICAL EXAM:  MENTAL STATUS: Speech: Wet slurred speech; depend on his family tonsil questions  Cognition:     Orientation: He is not oriented to date, year, month, date, season      Recent and remote memory: He missed 3 out of 3 recalls      Normal Attention span and concentration     Normal Language, naming, repeating, he has difficulty copy design, or write a sentence     CRANIAL NERVES: CN II: Visual fields are full to confrontation.Pupils are round equal and briskly reactive to light. CN III, IV, VI: extraocular movement are normal. No ptosis. CN V: Facial sensation is intact to pinprick in all 3 divisions  bilaterally. Corneal responses are intact.  CN VII: Face is symmetric with normal eye closure and smile. CN VIII: Hearing is normal to rubbing fingers CN IX, X: Palate elevates symmetrically. Phonation is normal. CN XI: Head turning and shoulder shrug are intact CN XII: Tongue is midline with normal movements and no atrophy.  MOTOR: There is no pronator drift of out-stretched arms. Muscle bulk and tone are normal. Muscle strength is normal.  REFLEXES: Reflexes are 2+ and symmetric at the biceps, triceps, knees, and ankles. Plantar responses are flexor.  SENSORY: Intact to light touch, pinprick, position sense, and vibration sense are intact in fingers and toes.  COORDINATION: Rapid alternating movements and fine finger movements are intact. There is no dysmetria on finger-to-nose and heel-knee-shin.    GAIT/STANCE: Posture is normal. Gait is steady with normal steps, base, arm swing, and turning. Heel and toe walking are normal. Tandem gait is normal.  Romberg is absent.   DIAGNOSTIC DATA (LABS, IMAGING, TESTING) -  ASSESSMENT AND PLAN  81 y.o. year old male  has a past medical history of hypertension; Hypothyroidism; BPH (benign prostatic hyperplasia); gradual onset memory trouble most consistent with Alzheimer's dementia. Neuropsych testing revealed moderate dementia most likely of the Alzheimer's type. He has continued to have a steady decline needing more help with activities of daily living, wearing depends now for bowel and bladder incontinence, frequent fall with fractured nose, unsteady gait, dysarthria and dysphagia. Dementia  Progressive worsening Dysphagia, dysarthria, Hyper sialorrhea  Botox injection  Start preauthorization today, will return in one month for injection once it is approved  Gait difficulty  Multifactorial, extensive periventricular white matter disease, dementia, possible lumbar sacral radiculopathy, bilateral hip pain   Marcial Pacas, M.D.  Ph.D.  [facility] [place], [place] [address] Phone: [phone] Fax:      [phone]

## 2015-08-29 ENCOUNTER — Telehealth: Payer: Self-pay | Admitting: Neurology

## 2015-08-29 NOTE — Telephone Encounter (Signed)
[facility] for In[facility]. Called the patient and spoke with his son. Informed him we were sending [facility] only his injection and to be expecting a call from them to schedule. He stated that he understood.

## 2015-09-17 ENCOUNTER — Ambulatory Visit (INDEPENDENT_AMBULATORY_CARE_PROVIDER_SITE_OTHER): Payer: Medicare Other | Admitting: Podiatry

## 2015-09-17 ENCOUNTER — Encounter: Payer: Self-pay | Admitting: Podiatry

## 2015-09-17 DIAGNOSIS — M79676 Pain in unspecified toe(s): Secondary | ICD-10-CM | POA: Diagnosis not present

## 2015-09-17 DIAGNOSIS — B351 Tinea unguium: Secondary | ICD-10-CM | POA: Diagnosis not present

## 2015-09-18 NOTE — Progress Notes (Signed)
Patient ID: William CockayneMangaldas Dieguez, male   DOB: 11/30/1925, 81 y.o.   MRN: [id number]  Subjective: This patient presents with his son present in the treatment room. Patient's son is requesting his father's toenails trimmed back because he complains of discomfort in the toenails are new wear shoes  Objective: Patient is non-responsive and is confused The toenails are hypertrophic, elongated, incurvated, discolored and tender direct palpation 6-10  Assessment: Symptomatic onychomycoses 6-10  Plan: Debridement toenails 10 mechanically and electrically without any bleeding  Reappoint 3 months

## 2015-11-18 ENCOUNTER — Emergency Department (HOSPITAL_COMMUNITY): Payer: Medicare Other

## 2015-11-18 ENCOUNTER — Inpatient Hospital Stay (HOSPITAL_COMMUNITY)
Admission: EM | Admit: 2015-11-18 | Discharge: 2015-12-17 | DRG: 870 | Disposition: E | Payer: Medicare Other | Attending: Pulmonary Disease | Admitting: Pulmonary Disease

## 2015-11-18 ENCOUNTER — Encounter (HOSPITAL_COMMUNITY): Payer: Self-pay | Admitting: Emergency Medicine

## 2015-11-18 DIAGNOSIS — R627 Adult failure to thrive: Secondary | ICD-10-CM | POA: Diagnosis present

## 2015-11-18 DIAGNOSIS — A419 Sepsis, unspecified organism: Principal | ICD-10-CM | POA: Diagnosis present

## 2015-11-18 DIAGNOSIS — F0390 Unspecified dementia without behavioral disturbance: Secondary | ICD-10-CM | POA: Diagnosis present

## 2015-11-18 DIAGNOSIS — G40101 Localization-related (focal) (partial) symptomatic epilepsy and epileptic syndromes with simple partial seizures, not intractable, with status epilepticus: Secondary | ICD-10-CM | POA: Diagnosis present

## 2015-11-18 DIAGNOSIS — Z91018 Allergy to other foods: Secondary | ICD-10-CM

## 2015-11-18 DIAGNOSIS — K5909 Other constipation: Secondary | ICD-10-CM | POA: Diagnosis present

## 2015-11-18 DIAGNOSIS — J9690 Respiratory failure, unspecified, unspecified whether with hypoxia or hypercapnia: Secondary | ICD-10-CM | POA: Diagnosis present

## 2015-11-18 DIAGNOSIS — K219 Gastro-esophageal reflux disease without esophagitis: Secondary | ICD-10-CM | POA: Diagnosis present

## 2015-11-18 DIAGNOSIS — E876 Hypokalemia: Secondary | ICD-10-CM | POA: Diagnosis present

## 2015-11-18 DIAGNOSIS — G9340 Encephalopathy, unspecified: Secondary | ICD-10-CM | POA: Diagnosis not present

## 2015-11-18 DIAGNOSIS — G40901 Epilepsy, unspecified, not intractable, with status epilepticus: Secondary | ICD-10-CM | POA: Diagnosis not present

## 2015-11-18 DIAGNOSIS — F0391 Unspecified dementia with behavioral disturbance: Secondary | ICD-10-CM | POA: Diagnosis not present

## 2015-11-18 DIAGNOSIS — E872 Acidosis: Secondary | ICD-10-CM | POA: Diagnosis present

## 2015-11-18 DIAGNOSIS — R001 Bradycardia, unspecified: Secondary | ICD-10-CM | POA: Diagnosis not present

## 2015-11-18 DIAGNOSIS — F0150 Vascular dementia without behavioral disturbance: Secondary | ICD-10-CM | POA: Diagnosis present

## 2015-11-18 DIAGNOSIS — I6203 Nontraumatic chronic subdural hemorrhage: Secondary | ICD-10-CM | POA: Diagnosis present

## 2015-11-18 DIAGNOSIS — G40209 Localization-related (focal) (partial) symptomatic epilepsy and epileptic syndromes with complex partial seizures, not intractable, without status epilepticus: Secondary | ICD-10-CM | POA: Diagnosis not present

## 2015-11-18 DIAGNOSIS — F319 Bipolar disorder, unspecified: Secondary | ICD-10-CM | POA: Diagnosis present

## 2015-11-18 DIAGNOSIS — Z515 Encounter for palliative care: Secondary | ICD-10-CM | POA: Diagnosis present

## 2015-11-18 DIAGNOSIS — R451 Restlessness and agitation: Secondary | ICD-10-CM | POA: Diagnosis not present

## 2015-11-18 DIAGNOSIS — E43 Unspecified severe protein-calorie malnutrition: Secondary | ICD-10-CM | POA: Diagnosis present

## 2015-11-18 DIAGNOSIS — N179 Acute kidney failure, unspecified: Secondary | ICD-10-CM | POA: Diagnosis present

## 2015-11-18 DIAGNOSIS — Z9289 Personal history of other medical treatment: Secondary | ICD-10-CM

## 2015-11-18 DIAGNOSIS — J690 Pneumonitis due to inhalation of food and vomit: Secondary | ICD-10-CM | POA: Diagnosis present

## 2015-11-18 DIAGNOSIS — W19XXXA Unspecified fall, initial encounter: Secondary | ICD-10-CM | POA: Insufficient documentation

## 2015-11-18 DIAGNOSIS — J9602 Acute respiratory failure with hypercapnia: Secondary | ICD-10-CM | POA: Diagnosis not present

## 2015-11-18 DIAGNOSIS — R1310 Dysphagia, unspecified: Secondary | ICD-10-CM | POA: Diagnosis not present

## 2015-11-18 DIAGNOSIS — K922 Gastrointestinal hemorrhage, unspecified: Secondary | ICD-10-CM | POA: Diagnosis not present

## 2015-11-18 DIAGNOSIS — Z01818 Encounter for other preprocedural examination: Secondary | ICD-10-CM | POA: Diagnosis not present

## 2015-11-18 DIAGNOSIS — R680 Hypothermia, not associated with low environmental temperature: Secondary | ICD-10-CM | POA: Diagnosis present

## 2015-11-18 DIAGNOSIS — D62 Acute posthemorrhagic anemia: Secondary | ICD-10-CM | POA: Diagnosis not present

## 2015-11-18 DIAGNOSIS — J9600 Acute respiratory failure, unspecified whether with hypoxia or hypercapnia: Secondary | ICD-10-CM

## 2015-11-18 DIAGNOSIS — N400 Enlarged prostate without lower urinary tract symptoms: Secondary | ICD-10-CM | POA: Diagnosis present

## 2015-11-18 DIAGNOSIS — R4182 Altered mental status, unspecified: Secondary | ICD-10-CM | POA: Insufficient documentation

## 2015-11-18 DIAGNOSIS — Z4659 Encounter for fitting and adjustment of other gastrointestinal appliance and device: Secondary | ICD-10-CM

## 2015-11-18 DIAGNOSIS — R569 Unspecified convulsions: Secondary | ICD-10-CM

## 2015-11-18 DIAGNOSIS — E039 Hypothyroidism, unspecified: Secondary | ICD-10-CM | POA: Diagnosis present

## 2015-11-18 DIAGNOSIS — H409 Unspecified glaucoma: Secondary | ICD-10-CM | POA: Diagnosis present

## 2015-11-18 DIAGNOSIS — Z6822 Body mass index (BMI) 22.0-22.9, adult: Secondary | ICD-10-CM | POA: Diagnosis not present

## 2015-11-18 DIAGNOSIS — Z79899 Other long term (current) drug therapy: Secondary | ICD-10-CM | POA: Diagnosis not present

## 2015-11-18 DIAGNOSIS — I959 Hypotension, unspecified: Secondary | ICD-10-CM | POA: Diagnosis not present

## 2015-11-18 DIAGNOSIS — I10 Essential (primary) hypertension: Secondary | ICD-10-CM | POA: Diagnosis present

## 2015-11-18 DIAGNOSIS — R404 Transient alteration of awareness: Secondary | ICD-10-CM

## 2015-11-18 DIAGNOSIS — Z87891 Personal history of nicotine dependence: Secondary | ICD-10-CM | POA: Diagnosis not present

## 2015-11-18 DIAGNOSIS — Z66 Do not resuscitate: Secondary | ICD-10-CM | POA: Diagnosis present

## 2015-11-18 DIAGNOSIS — W07XXXA Fall from chair, initial encounter: Secondary | ICD-10-CM | POA: Diagnosis present

## 2015-11-18 DIAGNOSIS — D649 Anemia, unspecified: Secondary | ICD-10-CM | POA: Diagnosis not present

## 2015-11-18 LAB — URINALYSIS, ROUTINE W REFLEX MICROSCOPIC
BILIRUBIN URINE: NEGATIVE
Glucose, UA: NEGATIVE mg/dL
HGB URINE DIPSTICK: NEGATIVE
KETONES UR: NEGATIVE mg/dL
Leukocytes, UA: NEGATIVE
NITRITE: NEGATIVE
PROTEIN: NEGATIVE mg/dL
Specific Gravity, Urine: 1.024 (ref 1.005–1.030)
pH: 6 (ref 5.0–8.0)

## 2015-11-18 LAB — COMPREHENSIVE METABOLIC PANEL
ALK PHOS: 52 U/L (ref 38–126)
ALT: 14 U/L — AB (ref 17–63)
AST: 30 U/L (ref 15–41)
Albumin: 3.8 g/dL (ref 3.5–5.0)
Anion gap: 13 (ref 5–15)
BUN: 28 mg/dL — AB (ref 6–20)
CALCIUM: 9.9 mg/dL (ref 8.9–10.3)
CHLORIDE: 104 mmol/L (ref 101–111)
CO2: 24 mmol/L (ref 22–32)
CREATININE: 1.29 mg/dL — AB (ref 0.61–1.24)
GFR, EST AFRICAN AMERICAN: 56 mL/min — AB (ref >60)
GFR, EST NON AFRICAN AMERICAN: 48 mL/min — AB (ref >60)
Glucose, Bld: 160 mg/dL — ABNORMAL HIGH (ref 65–99)
Potassium: 4.5 mmol/L (ref 3.5–5.1)
Sodium: 141 mmol/L (ref 135–145)
Total Bilirubin: 0.8 mg/dL (ref 0.3–1.2)
Total Protein: 6.5 g/dL (ref 6.5–8.1)

## 2015-11-18 LAB — I-STAT CG4 LACTIC ACID, ED
LACTIC ACID, VENOUS: 0.96 mmol/L (ref 0.5–2.0)
Lactic Acid, Venous: 2.98 mmol/L (ref 0.5–2.0)

## 2015-11-18 LAB — I-STAT ARTERIAL BLOOD GAS, ED
Acid-base deficit: 2 mmol/L (ref 0.0–2.0)
BICARBONATE: 24.4 meq/L — AB (ref 20.0–24.0)
O2 SAT: 90 %
TCO2: 26 mmol/L (ref 0–100)
pCO2 arterial: 43.4 mmHg (ref 35.0–45.0)
pH, Arterial: 7.353 (ref 7.350–7.450)
pO2, Arterial: 58 mmHg — ABNORMAL LOW (ref 80.0–100.0)

## 2015-11-18 LAB — I-STAT CHEM 8, ED
BUN: 34 mg/dL — AB (ref 6–20)
CREATININE: 1.2 mg/dL (ref 0.61–1.24)
Calcium, Ion: 1.26 mmol/L (ref 1.13–1.30)
Chloride: 103 mmol/L (ref 101–111)
GLUCOSE: 155 mg/dL — AB (ref 65–99)
HEMATOCRIT: 39 % (ref 39.0–52.0)
Hemoglobin: 13.3 g/dL (ref 13.0–17.0)
Potassium: 4.1 mmol/L (ref 3.5–5.1)
Sodium: 139 mmol/L (ref 135–145)
TCO2: 25 mmol/L (ref 0–100)

## 2015-11-18 LAB — I-STAT VENOUS BLOOD GAS, ED
ACID-BASE DEFICIT: 3 mmol/L — AB (ref 0.0–2.0)
Bicarbonate: 26 mEq/L — ABNORMAL HIGH (ref 20.0–24.0)
O2 SAT: 84 %
PCO2 VEN: 64.3 mmHg — AB (ref 45.0–50.0)
TCO2: 28 mmol/L (ref 0–100)
pH, Ven: 7.215 — ABNORMAL LOW (ref 7.250–7.300)
pO2, Ven: 60 mmHg — ABNORMAL HIGH (ref 30.0–45.0)

## 2015-11-18 LAB — PROTIME-INR
INR: 1.14 (ref 0.00–1.49)
Prothrombin Time: 14.8 seconds (ref 11.6–15.2)

## 2015-11-18 LAB — CBC WITH DIFFERENTIAL/PLATELET
BASOS PCT: 0 %
Basophils Absolute: 0 10*3/uL (ref 0.0–0.1)
EOS ABS: 0.2 10*3/uL (ref 0.0–0.7)
EOS PCT: 2 %
HCT: 36.6 % — ABNORMAL LOW (ref 39.0–52.0)
Hemoglobin: 11.8 g/dL — ABNORMAL LOW (ref 13.0–17.0)
LYMPHS ABS: 3 10*3/uL (ref 0.7–4.0)
Lymphocytes Relative: 34 %
MCH: 30.4 pg (ref 26.0–34.0)
MCHC: 32.2 g/dL (ref 30.0–36.0)
MCV: 94.3 fL (ref 78.0–100.0)
MONOS PCT: 7 %
Monocytes Absolute: 0.6 10*3/uL (ref 0.1–1.0)
NEUTROS PCT: 57 %
Neutro Abs: 5 10*3/uL (ref 1.7–7.7)
PLATELETS: 224 10*3/uL (ref 150–400)
RBC: 3.88 MIL/uL — ABNORMAL LOW (ref 4.22–5.81)
RDW: 14.1 % (ref 11.5–15.5)
WBC: 8.8 10*3/uL (ref 4.0–10.5)

## 2015-11-18 LAB — I-STAT TROPONIN, ED
TROPONIN I, POC: 0 ng/mL (ref 0.00–0.08)
Troponin i, poc: 0 ng/mL (ref 0.00–0.08)

## 2015-11-18 MED ORDER — VANCOMYCIN HCL IN DEXTROSE 1-5 GM/200ML-% IV SOLN
1000.0000 mg | Freq: Once | INTRAVENOUS | Status: AC
  Administered 2015-11-18: 1000 mg via INTRAVENOUS
  Filled 2015-11-18: qty 200

## 2015-11-18 MED ORDER — SODIUM CHLORIDE 0.9 % IV BOLUS (SEPSIS)
1000.0000 mL | INTRAVENOUS | Status: AC
  Administered 2015-11-18: 1000 mL via INTRAVENOUS

## 2015-11-18 MED ORDER — PIPERACILLIN-TAZOBACTAM 3.375 G IVPB
3.3750 g | Freq: Three times a day (TID) | INTRAVENOUS | Status: DC
  Administered 2015-11-19 – 2015-11-22 (×9): 3.375 g via INTRAVENOUS
  Filled 2015-11-18 (×14): qty 50

## 2015-11-18 MED ORDER — SODIUM CHLORIDE 0.9 % IV SOLN
INTRAVENOUS | Status: DC
  Administered 2015-11-18: via INTRAVENOUS

## 2015-11-18 MED ORDER — PIPERACILLIN-TAZOBACTAM 3.375 G IVPB: 3.3750 g | Freq: Three times a day (TID) | INTRAVENOUS | Status: DC

## 2015-11-18 MED ORDER — PIPERACILLIN-TAZOBACTAM 3.375 G IVPB 30 MIN
3.3750 g | Freq: Once | INTRAVENOUS | Status: AC
  Administered 2015-11-18: 3.375 g via INTRAVENOUS
  Filled 2015-11-18: qty 50

## 2015-11-18 NOTE — ED Notes (Signed)
In and out cath done per protocol.  Patient cleaned with soap and water prior to obtaining urine. Assisted by Elliot Gurney, RN

## 2015-11-18 NOTE — Progress Notes (Deleted)
Pharmacy Antibiotic Note  William Dougherty is a 82 y.o. male admitted on 10/25/2015 with pneumonia.  Pharmacy has been consulted for Zosyn dosing.  Plan: Zosyn 3.375g IV q8h (4 hour infusion).  Follow up Scr, cultures, fever trend   Temp (24hrs), Avg:98.3 F (36.8 C), Min:96.3 F (35.7 C), Max:100.3 F (37.9 C)   Recent Labs Lab 10/21/2015 1900 11/02/2015 1906 11/10/2015 2123  WBC  --  8.8  --   CREATININE 1.20 1.29*  --   LATICACIDVEN 2.98*  --  0.96    CrCl cannot be calculated (Unknown ideal weight.).    Allergies  Allergen Reactions  . Eggs Or Egg-Derived Products Diarrhea and Nausea Only     Thank you for allowing pharmacy to be a part of this patient's care.  Elwin Sleight 11/17/2015 11:23 PM

## 2015-11-18 NOTE — Consult Note (Signed)
Name: William Dougherty MRN: [id number] DOB: [birth year]    ADMISSION DATE:  12-10-15 CONSULTATION DATE:  10-Dec-2015  REFERRING MD :  EDP  CHIEF COMPLAINT:  Fall   HISTORY OF PRESENT ILLNESS:  William Dougherty is a 82 y.o. male with a PMH as outlined below.  He was in his USOH on evening of 10-Dec-2015 and after eating dinner, he was sitting at dinner table while family was in other room.  Family then heard a loud noise and when they came to dinner table, pt was lying on the floor.  EMS was dispatched and on en route to ED, pt had seizure like activity for 1 minute for which he was given  versed.  In ED, pt was hypothermic to 96.89F.  Initial lactate was 2.98 with repeat down to 0.96.  He had mild AKI (SCr 1.29).  ABG revealed hypoxemia; otherwise normal.   Head CT was negative for acute process.  Pt was minimally responsive however; therefore, he was placed on BiPAP.  Per notes, he remained minimally responsive; therefore, PCCM was called due to concern he may fail BiPAP.  During my exam, pt is pulling at mask and attempting to get it off.  Repeat lactate is down to 0.96.  Remaining labs essentially normal with exception of mild AKI.  Per family, pt has fairly significant dementia.  He is able to walk at home using a walker, but otherwise needs assistance with ADL's.  He is able to eat, but eats pureed food and requires assistance with feeding. Despite his dementia, family is certain that if needed, they would opt for short term life support / full code status.  PAST MEDICAL HISTORY :   has a past medical history of Hypertension; Hypothyroidism; BPH (benign prostatic hyperplasia); Glaucoma; GERD (gastroesophageal reflux disease); Knee pain, bilateral; Bipolar 1 disorder ([facility]); Hyperpigmentation; and Dementia.  has past surgical history that includes None. Prior to Admission medications   Medication Sig Start Date End Date Taking? Authorizing Provider  levothyroxine (SYNTHROID, LEVOTHROID) 50 MCG  tablet Take 50 mcg by mouth daily before breakfast.   Yes Historical Provider, MD  losartan-hydrochlorothiazide (HYZAAR) 50-12.5 MG per tablet Take 1 tablet by mouth every morning.   Yes Historical Provider, MD  TRAVATAN Z 0.004 % SOLN ophthalmic solution Place 1 drop into both eyes at bedtime. 10/31/15  Yes Historical Provider, MD  acetaminophen (TYLENOL) 500 MG tablet Take 2 tablets (1,000 mg total) by mouth every 6 (six) hours as needed. Patient not taking: Reported on 12-10-2015 12/29/14   Arby Barrette, MD  hydrOXYzine (ATARAX/VISTARIL) 10 MG tablet Take 1 tablet (10 mg total) by mouth at bedtime. Patient not taking: Reported on 2015-12-10 01/13/15   Asa Lente, MD   Allergies  Allergen Reactions  . Eggs Or Egg-Derived Products Diarrhea and Nausea Only    FAMILY HISTORY:  family history includes Hashimoto's thyroiditis in his grandchild; Hypothyroidism in his son; Vitiligo in his son. SOCIAL HISTORY:  reports that he quit smoking about 22 years ago. He has never used smokeless tobacco. He reports that he does not drink alcohol or use illicit drugs.  REVIEW OF SYSTEMS:   Unable to obtain as pt is encephalopathic.   SUBJECTIVE:  Pt on BiPAP, pulling at mask trying to get it off.  VITAL SIGNS: Temp:  [96.3 F (35.7 C)] 96.3 F (35.7 C) (01/31 1854) Pulse Rate:  [65-96] 75 (01/31 2200) Resp:  [12-19] 18 (01/31 2215) BP: (103-145)/(26-84) 104/49 mmHg (01/31 2215) SpO2:  [94 %-100 %]  99 % (01/31 2200)  PHYSICAL EXAMINATION: General: elderly appearing male, resting in bed, in NAD. Neuro: Withdraws to pain.  Does not answer questions or follow commands. HEENT: [place]/AT. PERRL, sclerae anicteric. Cardiovascular: RRR, no M/R/G.  Lungs: Respirations shallow and unlabored.  Clear bilaterally. Abdomen: BS x 4, soft, NT/ND.  Musculoskeletal: No gross deformities, no edema.  Skin: Intact, warm, no rashes.     Recent Labs Lab 10/23/2015 1900 11/17/2015 1906  NA 139 141  K 4.1 4.5   CL 103 104  CO2  --  24  BUN 34* 28*  CREATININE 1.20 1.29*  GLUCOSE 155* 160*    Recent Labs Lab 11/07/2015 1900 11/04/2015 1906  HGB 13.3 11.8*  HCT 39.0 36.6*  WBC  --  8.8  PLT  --  224   Ct Head Wo Contrast  10/21/2015  CLINICAL DATA:  Advanced dementia.  Altered mental status.  Fall. EXAM: CT HEAD WITHOUT CONTRAST CT CERVICAL SPINE WITHOUT CONTRAST TECHNIQUE: Multidetector CT imaging of the head and cervical spine was performed following the standard protocol without intravenous contrast. Multiplanar CT image reconstructions of the cervical spine were also generated. COMPARISON:  01/11/2015 FINDINGS: CT HEAD FINDINGS Prominence of the sulci and ventricles are identified compatible with brain atrophy. Low attenuation within the subcortical and periventricular white matter is noted compatible with small vessel ischemic disease. There is no abnormal extra-axial fluid collection, intracranial hemorrhage or mass. No evidence for acute brain infarct. There is mucosal thickening involving the left maxillary sinus. Chronic fracture involving the nasal bone identified. The mastoid air cells are clear. The calvarium is intact. CT CERVICAL SPINE FINDINGS The alignment of the cervical spine is normal. The vertebral body heights are well preserved. The facet joints are well aligned. No fracture identified. IMPRESSION: 1. No acute intracranial abnormalities. 2. Small vessel ischemic change and brain atrophy. 3. Cervical degenerative disc disease Electronically Signed   By: Signa Kell M.D.   On: 11/10/2015 19:56   Ct Cervical Spine Wo Contrast  11/01/2015  CLINICAL DATA:  Advanced dementia.  Altered mental status.  Fall. EXAM: CT HEAD WITHOUT CONTRAST CT CERVICAL SPINE WITHOUT CONTRAST TECHNIQUE: Multidetector CT imaging of the head and cervical spine was performed following the standard protocol without intravenous contrast. Multiplanar CT image reconstructions of the cervical spine were also  generated. COMPARISON:  01/11/2015 FINDINGS: CT HEAD FINDINGS Prominence of the sulci and ventricles are identified compatible with brain atrophy. Low attenuation within the subcortical and periventricular white matter is noted compatible with small vessel ischemic disease. There is no abnormal extra-axial fluid collection, intracranial hemorrhage or mass. No evidence for acute brain infarct. There is mucosal thickening involving the left maxillary sinus. Chronic fracture involving the nasal bone identified. The mastoid air cells are clear. The calvarium is intact. CT CERVICAL SPINE FINDINGS The alignment of the cervical spine is normal. The vertebral body heights are well preserved. The facet joints are well aligned. No fracture identified. IMPRESSION: 1. No acute intracranial abnormalities. 2. Small vessel ischemic change and brain atrophy. 3. Cervical degenerative disc disease Electronically Signed   By: Signa Kell M.D.   On: 10/26/2015 19:56   Dg Chest Port 1 View  10/28/2015  CLINICAL DATA:  Patient had underwent nodes fall to the floor from a chair. Patient has advanced dementia. EMS reports seizure activity. EXAM: PORTABLE CHEST 1 VIEW COMPARISON:  05/08/2015 FINDINGS: Cardiac silhouette is normal in size and configuration. No mediastinal or hilar masses or evidence of adenopathy. Clear lungs. No  pleural effusion or pneumothorax. Thorax is demineralized grossly intact. IMPRESSION: No acute cardiopulmonary disease. Electronically Signed   By: Amie Portland M.D.   On: 12/17/2015 19:16    STUDIES:  CT head and c-spine 01/31 > no acute process.  SIGNIFICANT EVENTS  01/31 > admitted after fall at home.  ASSESSMENT / PLAN:  Acute hypoxemic respiratory failure. Possible aspiration - no hx to support; however, pt has had dysphagia for years. Plan: No need for BiPAP at this time. Continue supplemental O2 as needed to maintain SpO2 > 92%. Assess PCT. Empiric zosyn for now. Follow CXR.  Fall -  unclear etiology; ? Syncope vs TIA. Initial head CT negative and no focal deficits on exam. Seizure activity reported by EMS. Plan: Monitor for further seizure activity. Consider repeat head CT vs MRI. Consider neurology consult.  AKI. Plan: Gentle IVF hydration.   CC time:  30 minutes.   Rutherford Guys, [place] [facility] Pager: [phone]  or [phone] December 17, 2015, 10:31 PM

## 2015-11-18 NOTE — ED Notes (Signed)
Pt arrives via [facility], ems reports patients family stated that patient was sitting at the dinner table, while they were in the other room, they heard a loud noise, came back into the room to patient lying on the floor. Ems reports left to right gaze which pts son states is normal with patients advanced dementia. Ems reports pta, patient had seizure activity and was given  versed. Pt maintaining airway at this time, 99% on ra.

## 2015-11-18 NOTE — ED Notes (Signed)
Patient restless and picking at IV  IV wrapped with Kerlix

## 2015-11-18 NOTE — Progress Notes (Signed)
Pharmacy Antibiotic Note  William Dougherty is a 82 y.o. male admitted on 11/10/2015 with sepsis.  Pharmacy has been consulted for vancomycin and zosyn dosing.  Plan: Vancomycin 1g load followed by, Vancomycin  IV every 24 hours.  Goal trough 15-20 mcg/mL. Zosyn 3.375g IV q8h (4 hour infusion).  Monitor culture data, renal function and clinical course VT at SS     Temp (24hrs), Avg:96.3 F (35.7 C), Min:96.3 F (35.7 C), Max:96.3 F (35.7 C)  No results for input(s): WBC, CREATININE, LATICACIDVEN, VANCOTROUGH, VANCOPEAK, VANCORANDOM, GENTTROUGH, GENTPEAK, GENTRANDOM, TOBRATROUGH, TOBRAPEAK, TOBRARND, AMIKACINPEAK, AMIKACINTROU, AMIKACIN in the last 168 hours.  CrCl cannot be calculated (Unknown ideal weight.).    Allergies  Allergen Reactions  . Eggs Or Egg-Derived Products Diarrhea and Nausea Only    Antimicrobials this admission: Vanc 1/31 >>  Zosyn 1/31 >>   Dose adjustments this admission:   Microbiology results:  BCx:   UCx:    Sputum:    MRSA PCR:   Arlean Hopping. Newman Pies, PharmD, BCPS Clinical Pharmacist Pager [phone] Marquita Palms 10/19/2015 7:00 PM  Pharmacy Code Sepsis Protocol  Time of code sepsis page: 4540  Antibiotics delivered at 1901  Antibiotics administered prior to code at  (if checked, omit next 2 questions)  Were antibiotics ordered at the time of the code sepsis page? Yes Was it required to contact the physician?  Physician not contacted  Physician contacted to order antibiotics for code sepsis  Physician contacted to recommend changing antibiotics  Pharmacy consulted for: vancomycin and zosyn  Anti-infectives    Start     Dose/Rate Route Frequency Ordered Stop   10/23/2015 1900  piperacillin-tazobactam (ZOSYN) IVPB 3.375 g     3.375 g 100 mL/hr over 30 Minutes Intravenous  Once 10/20/2015 1851     10/23/2015 1900  vancomycin (VANCOCIN) IVPB 1000 mg/200 mL premix     1,000 mg 200 mL/hr over 60 Minutes Intravenous  Once 11/04/2015  1851          Nurse education provided:  Minutes left to administer antibiotics to achieve 1 hour goal  Correct order of antibiotic administration  Antibiotic Y-site compatibilities     Marquita Palms, PharmD 10/28/2015, 7:02 PM

## 2015-11-18 NOTE — ED Notes (Signed)
Respiratory at bedside to place patient on bipap 

## 2015-11-18 NOTE — ED Provider Notes (Signed)
Level V caveat altered mental status. History is obtained from EMS and from patient's son and daughter-in-law who accompany him Seen on arrival. Patient became less responsive approximately 5 PM today, family members report he fell off of a chair.[facility] reports the patient had a seizure lasting 1 minute immediately prior to arrival. They treated him with Versed 1 mg intravenously obtain CBG which was 137, and inserted nasal trumpet and place patient on nasal prong oxygen. Patient has history of dementia. He is normally alert and ambulatory. On exam a frail elderly chronically appearing male. Response to noxious stimulus with nonpurposeful movement. Opens eyes to noxious stimulus. Glasgow Coma Score equals 7 HEENT exam no facial asymmetry eyes with left-sided iridectomy. Right pupil pinpoint, nonreactive. No gaze deviation No deviation of gaze. Neck supple lungs clear breath sounds heart regular rate and rhythm abdomen nondistended nontender extremities without edema Patient placed on BiPAP at 7:15 PM at 7:30 PM he remains essentially unresponsive, unchanged mental status. I've had a lengthy discussion with patient's son and daughter-in-law they do wish life support if indicated. Critical care team consulted and evaluated patient in the emergency department. They feel the patient had lower seizure threshold from probable underlying sepsis and dementia. Suggest admission to stepdown unit. Palliative care consult ordered to discuss goals of care with family Results for orders placed or performed during the hospital encounter of 11/13/2015  Comprehensive metabolic panel  Result Value Ref Range   Sodium 141 135 - 145 mmol/L   Potassium 4.5 3.5 - 5.1 mmol/L   Chloride 104 101 - 111 mmol/L   CO2 24 22 - 32 mmol/L   Glucose, Bld 160 (H) 65 - 99 mg/dL   BUN 28 (H) 6 - 20 mg/dL   Creatinine, Ser 1.91 (H) 0.61 - 1.24 mg/dL   Calcium 9.9 8.9 - 47.8 mg/dL   Total Protein 6.5 6.5 - 8.1 g/dL   Albumin 3.8 3.5 - 5.0  g/dL   AST 30 15 - 41 U/L   ALT 14 (L) 17 - 63 U/L   Alkaline Phosphatase 52 38 - 126 U/L   Total Bilirubin 0.8 0.3 - 1.2 mg/dL   GFR calc non Af Amer 48 (L) >60 mL/min   GFR calc Af Amer 56 (L) >60 mL/min   Anion gap 13 5 - 15  CBC WITH DIFFERENTIAL  Result Value Ref Range   WBC 8.8 4.0 - 10.5 K/uL   RBC 3.88 (L) 4.22 - 5.81 MIL/uL   Hemoglobin 11.8 (L) 13.0 - 17.0 g/dL   HCT 29.5 (L) 62.1 - 30.8 %   MCV 94.3 78.0 - 100.0 fL   MCH 30.4 26.0 - 34.0 pg   MCHC 32.2 30.0 - 36.0 g/dL   RDW 65.7 84.6 - 96.2 %   Platelets 224 150 - 400 K/uL   Neutrophils Relative % 57 %   Neutro Abs 5.0 1.7 - 7.7 K/uL   Lymphocytes Relative 34 %   Lymphs Abs 3.0 0.7 - 4.0 K/uL   Monocytes Relative 7 %   Monocytes Absolute 0.6 0.1 - 1.0 K/uL   Eosinophils Relative 2 %   Eosinophils Absolute 0.2 0.0 - 0.7 K/uL   Basophils Relative 0 %   Basophils Absolute 0.0 0.0 - 0.1 K/uL  Urinalysis, Routine w reflex microscopic (not at [facility])  Result Value Ref Range   Color, Urine YELLOW YELLOW   APPearance CLEAR CLEAR   Specific Gravity, Urine 1.024 1.005 - 1.030   pH 6.0 5.0 - 8.0  Glucose, UA NEGATIVE NEGATIVE mg/dL   Hgb urine dipstick NEGATIVE NEGATIVE   Bilirubin Urine NEGATIVE NEGATIVE   Ketones, ur NEGATIVE NEGATIVE mg/dL   Protein, ur NEGATIVE NEGATIVE mg/dL   Nitrite NEGATIVE NEGATIVE   Leukocytes, UA NEGATIVE NEGATIVE  Protime-INR  Result Value Ref Range   Prothrombin Time 14.8 11.6 - 15.2 seconds   INR 1.14 0.00 - 1.49  I-Stat CG4 Lactic Acid, ED  (not at  [facility])  Result Value Ref Range   Lactic Acid, Venous 2.98 (HH) 0.5 - 2.0 mmol/L   Comment NOTIFIED PHYSICIAN   I-stat troponin, ED (not at [facility])  Result Value Ref Range   Troponin i, poc 0.00 0.00 - 0.08 ng/mL   Comment 3          I-Stat venous blood gas, ED ([facility])  Result Value Ref Range   pH, Ven 7.215 (L) 7.250 - 7.300   pCO2, Ven 64.3 (H) 45.0 - 50.0 mmHg   pO2, Ven 60.0 (H) 30.0 - 45.0 mmHg   Bicarbonate 26.0 (H) 20.0 -  24.0 mEq/L   TCO2 28 0 - 100 mmol/L   O2 Saturation 84.0 %   Acid-base deficit 3.0 (H) 0.0 - 2.0 mmol/L   Patient temperature HIDE    Sample type VENOUS    Comment NOTIFIED PHYSICIAN   I-stat Chem 8, ED  Result Value Ref Range   Sodium 139 135 - 145 mmol/L   Potassium 4.1 3.5 - 5.1 mmol/L   Chloride 103 101 - 111 mmol/L   BUN 34 (H) 6 - 20 mg/dL   Creatinine, Ser 1.61 0.61 - 1.24 mg/dL   Glucose, Bld 096 (H) 65 - 99 mg/dL   Calcium, Ion 0.45 4.09 - 1.30 mmol/L   TCO2 25 0 - 100 mmol/L   Hemoglobin 13.3 13.0 - 17.0 g/dL   HCT 81.1 91.4 - 78.2 %  I-Stat arterial blood gas, ED  Result Value Ref Range   pH, Arterial 7.353 7.350 - 7.450   pCO2 arterial 43.4 35.0 - 45.0 mmHg   pO2, Arterial 58.0 (L) 80.0 - 100.0 mmHg   Bicarbonate 24.4 (H) 20.0 - 24.0 mEq/L   TCO2 26 0 - 100 mmol/L   O2 Saturation 90.0 %   Acid-base deficit 2.0 0.0 - 2.0 mmol/L   Patient temperature 96.3 F    Collection site RADIAL, ALLEN'S TEST ACCEPTABLE    Drawn by Operator    Sample type ARTERIAL   I-Stat Troponin, ED (not at [facility])  Result Value Ref Range   Troponin i, poc 0.00 0.00 - 0.08 ng/mL   Comment 3          I-Stat CG4 Lactic Acid, ED  Result Value Ref Range   Lactic Acid, Venous 0.96 0.5 - 2.0 mmol/L   Ct Head Wo Contrast  11/17/2015  CLINICAL DATA:  Advanced dementia.  Altered mental status.  Fall. EXAM: CT HEAD WITHOUT CONTRAST CT CERVICAL SPINE WITHOUT CONTRAST TECHNIQUE: Multidetector CT imaging of the head and cervical spine was performed following the standard protocol without intravenous contrast. Multiplanar CT image reconstructions of the cervical spine were also generated. COMPARISON:  01/11/2015 FINDINGS: CT HEAD FINDINGS Prominence of the sulci and ventricles are identified compatible with brain atrophy. Low attenuation within the subcortical and periventricular white matter is noted compatible with small vessel ischemic disease. There is no abnormal extra-axial fluid collection,  intracranial hemorrhage or mass. No evidence for acute brain infarct. There is mucosal thickening involving the left maxillary sinus. Chronic  fracture involving the nasal bone identified. The mastoid air cells are clear. The calvarium is intact. CT CERVICAL SPINE FINDINGS The alignment of the cervical spine is normal. The vertebral body heights are well preserved. The facet joints are well aligned. No fracture identified. IMPRESSION: 1. No acute intracranial abnormalities. 2. Small vessel ischemic change and brain atrophy. 3. Cervical degenerative disc disease Electronically Signed   By: Signa Kell M.D.   On: Dec 02, 2015 19:56   Ct Cervical Spine Wo Contrast  2015/12/02  CLINICAL DATA:  Advanced dementia.  Altered mental status.  Fall. EXAM: CT HEAD WITHOUT CONTRAST CT CERVICAL SPINE WITHOUT CONTRAST TECHNIQUE: Multidetector CT imaging of the head and cervical spine was performed following the standard protocol without intravenous contrast. Multiplanar CT image reconstructions of the cervical spine were also generated. COMPARISON:  01/11/2015 FINDINGS: CT HEAD FINDINGS Prominence of the sulci and ventricles are identified compatible with brain atrophy. Low attenuation within the subcortical and periventricular white matter is noted compatible with small vessel ischemic disease. There is no abnormal extra-axial fluid collection, intracranial hemorrhage or mass. No evidence for acute brain infarct. There is mucosal thickening involving the left maxillary sinus. Chronic fracture involving the nasal bone identified. The mastoid air cells are clear. The calvarium is intact. CT CERVICAL SPINE FINDINGS The alignment of the cervical spine is normal. The vertebral body heights are well preserved. The facet joints are well aligned. No fracture identified. IMPRESSION: 1. No acute intracranial abnormalities. 2. Small vessel ischemic change and brain atrophy. 3. Cervical degenerative disc disease Electronically Signed    By: Signa Kell M.D.   On: 12/02/15 19:56   Dg Chest Port 1 View  Dec 02, 2015  CLINICAL DATA:  Patient had underwent nodes fall to the floor from a chair. Patient has advanced dementia. EMS reports seizure activity. EXAM: PORTABLE CHEST 1 VIEW COMPARISON:  05/08/2015 FINDINGS: Cardiac silhouette is normal in size and configuration. No mediastinal or hilar masses or evidence of adenopathy. Clear lungs. No pleural effusion or pneumothorax. Thorax is demineralized grossly intact. IMPRESSION: No acute cardiopulmonary disease. Electronically Signed   By: Amie Portland M.D.   On: December 02, 2015 19:16    CRITICAL CARE Performed by: Doug Sou Total critical care time: 40 minutes Critical care time was exclusive of separately billable procedures and treating other patients. Critical care was necessary to treat or prevent imminent or life-threatening deterioration. Critical care was time spent personally by me on the following activities: development of treatment plan with patient and/or surrogate as well as nursing, discussions with consultants, evaluation of patient's response to treatment, examination of patient, obtaining history from patient or surrogate, ordering and performing treatments and interventions, ordering and review of laboratory studies, ordering and review of radiographic studies, pulse oximetry and re-evaluation of patient's condition.  Doug Sou, MD 02-Dec-2015 [phone]

## 2015-11-18 NOTE — ED Notes (Addendum)
Note made in error

## 2015-11-18 NOTE — ED Notes (Signed)
bair hugger applied.

## 2015-11-18 NOTE — ED Provider Notes (Signed)
CSN: [id number]     Arrival date & time 10/30/2015  1836 History   First MD Initiated Contact with Patient 10/22/2015 1847     Chief Complaint  Patient presents with  . Altered Mental Status  . Fall  . Seizures     (Consider location/radiation/quality/duration/timing/severity/associated sxs/prior Treatment) Patient is a 82 y.o. male presenting with altered mental status. The history is provided by a relative and the EMS personnel. The history is limited by the condition of the patient.  Altered Mental Status Presenting symptoms: disorientation, lethargy and partial responsiveness   Severity:  Severe Most recent episode:  Today Episode history:  Single Duration:  1 day Timing:  Constant Progression:  Unchanged Chronicity:  New Context: dementia   Context comment:  Fall from chair. seizure with ems   Past Medical History  Diagnosis Date  . Hypertension   . Hypothyroidism   . BPH (benign prostatic hyperplasia)   . Glaucoma   . GERD (gastroesophageal reflux disease)   . Knee pain, bilateral   . Bipolar 1 disorder (HCC)   . Hyperpigmentation     left maxilla  . Dementia    Past Surgical History  Procedure Laterality Date  . None     Family History  Problem Relation Age of Onset  . Hypothyroidism Son   . Vitiligo Son   . Hashimoto's thyroiditis Grandchild    Social History  Substance Use Topics  . Smoking status: Former Smoker    Quit date: 08/10/1993  . Smokeless tobacco: Never Used  . Alcohol Use: No    Review of Systems  Unable to perform ROS: Dementia      Allergies  Eggs or egg-derived products  Home Medications   Prior to Admission medications   Medication Sig Start Date End Date Taking? Authorizing Provider  levothyroxine (SYNTHROID, LEVOTHROID) 50 MCG tablet Take 50 mcg by mouth daily before breakfast.   Yes Historical Provider, MD  losartan-hydrochlorothiazide (HYZAAR) 50-12.5 MG per tablet Take 1 tablet by mouth every morning.   Yes Historical  Provider, MD  TRAVATAN Z 0.004 % SOLN ophthalmic solution Place 1 drop into both eyes at bedtime. 10/31/15  Yes Historical Provider, MD  acetaminophen (TYLENOL) 500 MG tablet Take 2 tablets (1,000 mg total) by mouth every 6 (six) hours as needed. Patient not taking: Reported on 10/22/2015 12/29/14   Arby Barrette, MD  hydrOXYzine (ATARAX/VISTARIL) 10 MG tablet Take 1 tablet (10 mg total) by mouth at bedtime. Patient not taking: Reported on 10/24/2015 01/13/15   Asa Lente, MD   BP 148/75 mmHg  Pulse 76  Temp(Src) 97.6 F (36.4 C) (Axillary)  Resp 17  Ht  (1.626 m)  Wt 52.2 kg  BMI 19.74 kg/m2  SpO2 96% Physical Exam  Constitutional: He appears cachectic. He appears ill.  HENT:  Nasal trumpet in place  Eyes: No scleral icterus.  Left pupil dilated. Family reports cataract surgery  Neck: No JVD present.  Cardiovascular: Normal heart sounds and intact distal pulses.   tachycardia  Pulmonary/Chest:  Coarse breath sounds throughout  Abdominal: Soft. He exhibits no distension.  Musculoskeletal: He exhibits no edema or tenderness.  Back without traumatic findings  Neurological: He is unresponsive. No cranial nerve deficit. GCS eye subscore is 3. GCS verbal subscore is 1. GCS motor subscore is 3.  Skin: No rash noted. No erythema. No pallor.  Nursing note and vitals reviewed.   ED Course  Procedures (including critical care time) Labs Review Labs Reviewed  COMPREHENSIVE METABOLIC  PANEL - Abnormal; Notable for the following:    Glucose, Bld 160 (*)    BUN 28 (*)    Creatinine, Ser 1.29 (*)    ALT 14 (*)    GFR calc non Af Amer 48 (*)    GFR calc Af Amer 56 (*)    All other components within normal limits  CBC WITH DIFFERENTIAL/PLATELET - Abnormal; Notable for the following:    RBC 3.88 (*)    Hemoglobin 11.8 (*)    HCT 36.6 (*)    All other components within normal limits  MAGNESIUM - Abnormal; Notable for the following:    Magnesium 1.6 (*)    All other components  within normal limits  I-STAT CG4 LACTIC ACID, ED - Abnormal; Notable for the following:    Lactic Acid, Venous 2.98 (*)    All other components within normal limits  I-STAT VENOUS BLOOD GAS, ED - Abnormal; Notable for the following:    pH, Ven 7.215 (*)    pCO2, Ven 64.3 (*)    pO2, Ven 60.0 (*)    Bicarbonate 26.0 (*)    Acid-base deficit 3.0 (*)    All other components within normal limits  I-STAT CHEM 8, ED - Abnormal; Notable for the following:    BUN 34 (*)    Glucose, Bld 155 (*)    All other components within normal limits  I-STAT ARTERIAL BLOOD GAS, ED - Abnormal; Notable for the following:    pO2, Arterial 58.0 (*)    Bicarbonate 24.4 (*)    All other components within normal limits  CULTURE, BLOOD (ROUTINE X 2)  CULTURE, BLOOD (ROUTINE X 2)  URINE CULTURE  MRSA PCR SCREENING  GRAM STAIN  URINALYSIS, ROUTINE W REFLEX MICROSCOPIC (NOT AT [facility])  PROTIME-INR  PROCALCITONIN  PHOSPHORUS  TROPONIN I  INFLUENZA PANEL BY PCR (TYPE A & B, H1N1)  GLUCOSE, CAPILLARY  PHENYTOIN LEVEL, TOTAL  I-STAT TROPOININ, ED  I-STAT TROPOININ, ED  I-STAT CG4 LACTIC ACID, ED  I-STAT CG4 LACTIC ACID, ED  I-STAT CG4 LACTIC ACID, ED    Imaging Review Ct Head Wo Contrast  10/26/2015  CLINICAL DATA:  Advanced dementia.  Altered mental status.  Fall. EXAM: CT HEAD WITHOUT CONTRAST CT CERVICAL SPINE WITHOUT CONTRAST TECHNIQUE: Multidetector CT imaging of the head and cervical spine was performed following the standard protocol without intravenous contrast. Multiplanar CT image reconstructions of the cervical spine were also generated. COMPARISON:  01/11/2015 FINDINGS: CT HEAD FINDINGS Prominence of the sulci and ventricles are identified compatible with brain atrophy. Low attenuation within the subcortical and periventricular white matter is noted compatible with small vessel ischemic disease. There is no abnormal extra-axial fluid collection, intracranial hemorrhage or mass. No evidence for acute  brain infarct. There is mucosal thickening involving the left maxillary sinus. Chronic fracture involving the nasal bone identified. The mastoid air cells are clear. The calvarium is intact. CT CERVICAL SPINE FINDINGS The alignment of the cervical spine is normal. The vertebral body heights are well preserved. The facet joints are well aligned. No fracture identified. IMPRESSION: 1. No acute intracranial abnormalities. 2. Small vessel ischemic change and brain atrophy. 3. Cervical degenerative disc disease Electronically Signed   By: Signa Kell M.D.   On: 11/06/2015 19:56   Ct Cervical Spine Wo Contrast  10/24/2015  CLINICAL DATA:  Advanced dementia.  Altered mental status.  Fall. EXAM: CT HEAD WITHOUT CONTRAST CT CERVICAL SPINE WITHOUT CONTRAST TECHNIQUE: Multidetector CT imaging of the head and cervical  spine was performed following the standard protocol without intravenous contrast. Multiplanar CT image reconstructions of the cervical spine were also generated. COMPARISON:  01/11/2015 FINDINGS: CT HEAD FINDINGS Prominence of the sulci and ventricles are identified compatible with brain atrophy. Low attenuation within the subcortical and periventricular white matter is noted compatible with small vessel ischemic disease. There is no abnormal extra-axial fluid collection, intracranial hemorrhage or mass. No evidence for acute brain infarct. There is mucosal thickening involving the left maxillary sinus. Chronic fracture involving the nasal bone identified. The mastoid air cells are clear. The calvarium is intact. CT CERVICAL SPINE FINDINGS The alignment of the cervical spine is normal. The vertebral body heights are well preserved. The facet joints are well aligned. No fracture identified. IMPRESSION: 1. No acute intracranial abnormalities. 2. Small vessel ischemic change and brain atrophy. 3. Cervical degenerative disc disease Electronically Signed   By: Signa Kell M.D.   On: 10/21/2015 19:56   Mr  Brain Wo Contrast  11/19/2015  CLINICAL DATA:  Altered mental status beginning yesterday evening, witnessed seizure en route to hospital. Hypothermia, subsequent low-grade fever. History of hypertension, dementia. EXAM: MRI HEAD WITHOUT CONTRAST TECHNIQUE: Multiplanar, multiecho pulse sequences of the brain and surrounding structures were obtained without intravenous contrast. COMPARISON:  CT head November 18, 2015 and MRI head August 20, 2013 FINDINGS: Multiple sequences are motion degraded. No reduced diffusion to suggest acute ischemia. No susceptibility artifact to suggest hemorrhage. Severe ventriculomegaly on the basis of global parenchymal brain volume loss. Disproportionate severe midbrain volume loss. RIGHT basal ganglia lacunar infarct. No midline shift, mass lesions or mass effect. Scattered subcentimeter supratentorial white matter T2 hyperintensities compatible with mild chronic small vessel ischemic disease. Bilateral holo hemispheric 2-3 mm chronic subdural hematomas, unchanged from prior MRI. Dolicoectatic intracranial vessels at the skull base. Mild paranasal sinus mucosal thickening with LEFT maxillary mucosal retention cyst. Mastoid air cells are well aerated. Craniocervical junction intact. No suspicious calvarial bone marrow signal. IMPRESSION: No acute intracranial process. Stable appearance of the head including severe global brain atrophy, with severe midbrain volume loss which can be associated with supranuclear palsy. Small bilateral chronic subdural hematomas. Electronically Signed   By: Awilda Metro M.D.   On: 11/19/2015 05:35   Dg Chest Port 1 View  10/21/2015  CLINICAL DATA:  Patient had underwent nodes fall to the floor from a chair. Patient has advanced dementia. EMS reports seizure activity. EXAM: PORTABLE CHEST 1 VIEW COMPARISON:  05/08/2015 FINDINGS: Cardiac silhouette is normal in size and configuration. No mediastinal or hilar masses or evidence of adenopathy. Clear  lungs. No pleural effusion or pneumothorax. Thorax is demineralized grossly intact. IMPRESSION: No acute cardiopulmonary disease. Electronically Signed   By: Amie Portland M.D.   On: 11/12/2015 19:16   I have personally reviewed and evaluated these images and lab results as part of my medical decision-making.   EKG Interpretation   Date/Time:  Tuesday November 18 2015 18:41:47 EST Ventricular Rate:  93 PR Interval:  168 QRS Duration: 145 QT Interval:  380 QTC Calculation: 473 R Axis:   83 Text Interpretation:  Sinus rhythm Right bundle branch block Artifact in  lead(s) I II III aVR aVL aVF V1 V2 V3 V6 No significant change since last  tracing Confirmed by JACUBOWITZ  MD, SAM [phone]) on 10/24/2015 7:20:24 PM      MDM   Final diagnoses:  Acute respiratory failure with hypercapnia (HCC)  Seizure (HCC)  Sepsis, due to unspecified organism (HCC)  Altered consciousness  Altered mental status    Patient is a 82 year old male with a history of dementia who presents via EMS. It is reported that patient had decrease in mental status today and had a unwitnessed fall from a chair. Per EMS he had a seizure that lasted about 1 minute prior to arrival and was given Versed. Blood glucose at the time was in the 130s. Further history and exam as above notable for tachycardia and a hypothermia and a GCS of 7.  Given concern for sepsis at this time patient started on IV fluids and antibiotics. Head CT obtained and neck CT obtained without acute traumatic injuries. Chest x-ray without obvious pneumonia. Patient found to have a respiratory acidosis with elevated lactate at 2.98. Troponin negative and urine without UTI. Given patient's advanced dementia the decision was made to trial him on BiPAP and attempt to stave off intubation. Blood gas improved after BiPAP and mental status improved. Critical care consulted. Patient will be admitted to stepdown unit for further management and evaluation. Unclear cause a  seizure and mental status at this time. Concern for possible sepsis lowering seizure threshold in the setting of dementia    Marijean Niemann, MD 11/19/15 1252  Doug Sou, MD 11/21/15 [phone]

## 2015-11-19 ENCOUNTER — Telehealth: Payer: Self-pay | Admitting: Neurology

## 2015-11-19 ENCOUNTER — Inpatient Hospital Stay (HOSPITAL_COMMUNITY): Payer: Medicare Other

## 2015-11-19 ENCOUNTER — Encounter (HOSPITAL_COMMUNITY): Payer: Self-pay | Admitting: Internal Medicine

## 2015-11-19 DIAGNOSIS — R404 Transient alteration of awareness: Secondary | ICD-10-CM | POA: Insufficient documentation

## 2015-11-19 DIAGNOSIS — J9602 Acute respiratory failure with hypercapnia: Secondary | ICD-10-CM | POA: Diagnosis present

## 2015-11-19 DIAGNOSIS — E039 Hypothyroidism, unspecified: Secondary | ICD-10-CM | POA: Diagnosis present

## 2015-11-19 DIAGNOSIS — I10 Essential (primary) hypertension: Secondary | ICD-10-CM | POA: Diagnosis present

## 2015-11-19 DIAGNOSIS — A419 Sepsis, unspecified organism: Secondary | ICD-10-CM | POA: Insufficient documentation

## 2015-11-19 DIAGNOSIS — R569 Unspecified convulsions: Secondary | ICD-10-CM

## 2015-11-19 DIAGNOSIS — G9340 Encephalopathy, unspecified: Secondary | ICD-10-CM

## 2015-11-19 LAB — INFLUENZA PANEL BY PCR (TYPE A & B)
H1N1 flu by pcr: NOT DETECTED
INFLAPCR: NEGATIVE
INFLBPCR: NEGATIVE

## 2015-11-19 LAB — BLOOD GAS, ARTERIAL
Acid-base deficit: 3 mmol/L — ABNORMAL HIGH (ref 0.0–2.0)
Bicarbonate: 20.4 mEq/L (ref 20.0–24.0)
Drawn by: 105521
FIO2: 0.5
LHR: 14 {breaths}/min
MECHVT: 480 mL
O2 Saturation: 99.6 %
PATIENT TEMPERATURE: 98.6
PCO2 ART: 30.2 mmHg — AB (ref 35.0–45.0)
PEEP/CPAP: 5 cmH2O
PH ART: 7.445 (ref 7.350–7.450)
PO2 ART: 233 mmHg — AB (ref 80.0–100.0)
TCO2: 21.3 mmol/L (ref 0–100)

## 2015-11-19 LAB — URINE CULTURE: CULTURE: NO GROWTH

## 2015-11-19 LAB — MAGNESIUM: Magnesium: 1.6 mg/dL — ABNORMAL LOW (ref 1.7–2.4)

## 2015-11-19 LAB — I-STAT CG4 LACTIC ACID, ED: Lactic Acid, Venous: 1.31 mmol/L (ref 0.5–2.0)

## 2015-11-19 LAB — GLUCOSE, CAPILLARY: Glucose-Capillary: 75 mg/dL (ref 65–99)

## 2015-11-19 LAB — PHENYTOIN LEVEL, TOTAL: Phenytoin Lvl: 23.6 ug/mL — ABNORMAL HIGH (ref 10.0–20.0)

## 2015-11-19 LAB — PROCALCITONIN: Procalcitonin: <0.1 ng/mL

## 2015-11-19 LAB — MRSA PCR SCREENING: MRSA by PCR: NEGATIVE

## 2015-11-19 LAB — TROPONIN I
Troponin I: 0.03 ng/mL (ref <0.031)
Troponin I: <0.03 ng/mL (ref <0.031)

## 2015-11-19 LAB — PHOSPHORUS: Phosphorus: 3 mg/dL (ref 2.5–4.6)

## 2015-11-19 MED ORDER — PROPOFOL 1000 MG/100ML IV EMUL
60.0000 ug/kg/min | INTRAVENOUS | Status: DC
  Administered 2015-11-19: 60 ug/kg/min via INTRAVENOUS
  Administered 2015-11-19: 80 ug/kg/min via INTRAVENOUS
  Administered 2015-11-20: 20 ug/kg/min via INTRAVENOUS
  Administered 2015-11-20: 60 ug/kg/min via INTRAVENOUS
  Administered 2015-11-20: 50 ug/kg/min via INTRAVENOUS
  Administered 2015-11-21: 47 ug/kg/min via INTRAVENOUS
  Filled 2015-11-19 (×5): qty 100

## 2015-11-19 MED ORDER — PHENYLEPHRINE HCL 10 MG/ML IJ SOLN
30.0000 ug/min | INTRAVENOUS | Status: DC
  Administered 2015-11-19: 35 ug/min via INTRAVENOUS
  Administered 2015-11-19: 100 ug/min via INTRAVENOUS
  Filled 2015-11-19 (×4): qty 1

## 2015-11-19 MED ORDER — FENTANYL CITRATE (PF) 100 MCG/2ML IJ SOLN
INTRAMUSCULAR | Status: AC
  Filled 2015-11-19: qty 2

## 2015-11-19 MED ORDER — LORAZEPAM 2 MG/ML IJ SOLN
2.0000 mg | Freq: Once | INTRAMUSCULAR | Status: DC
  Administered 2015-11-19: 2 mg via INTRAVENOUS

## 2015-11-19 MED ORDER — SODIUM CHLORIDE 0.9 % IV SOLN
1050.0000 mg | INTRAVENOUS | Status: AC
  Administered 2015-11-19: 1050 mg via INTRAVENOUS
  Filled 2015-11-19: qty 21

## 2015-11-19 MED ORDER — ATROPINE SULFATE 1 MG/ML IJ SOLN: 0.4000 mg | Freq: Once | INTRAMUSCULAR | Status: AC

## 2015-11-19 MED ORDER — LATANOPROST 0.005 % OP SOLN
1.0000 [drp] | Freq: Every day | OPHTHALMIC | Status: DC
  Administered 2015-11-19 – 2015-11-25 (×7): 1 [drp] via OPHTHALMIC
  Filled 2015-11-19 (×2): qty 2.5

## 2015-11-19 MED ORDER — PANTOPRAZOLE SODIUM 40 MG IV SOLR
40.0000 mg | Freq: Every day | INTRAVENOUS | Status: DC
  Administered 2015-11-19 – 2015-11-21 (×3): 40 mg via INTRAVENOUS
  Filled 2015-11-19 (×3): qty 40

## 2015-11-19 MED ORDER — VANCOMYCIN HCL IN DEXTROSE 750-5 MG/150ML-% IV SOLN
750.0000 mg | INTRAVENOUS | Status: DC
  Administered 2015-11-19 – 2015-11-21 (×3): 750 mg via INTRAVENOUS
  Filled 2015-11-19 (×4): qty 150

## 2015-11-19 MED ORDER — LORAZEPAM 2 MG/ML IJ SOLN
INTRAMUSCULAR | Status: AC
  Filled 2015-11-19: qty 1

## 2015-11-19 MED ORDER — ATROPINE SULFATE 0.1 MG/ML IJ SOLN
INTRAMUSCULAR | Status: AC
  Administered 2015-11-19: 1 mg
  Filled 2015-11-19: qty 10

## 2015-11-19 MED ORDER — SODIUM CHLORIDE 0.9 % IV SOLN
20.0000 mg/h | INTRAVENOUS | Status: DC
  Administered 2015-11-19: 1 mg/h via INTRAVENOUS
  Administered 2015-11-20 (×2): 20 mg/h via INTRAVENOUS
  Administered 2015-11-20: 15 mg/h via INTRAVENOUS
  Administered 2015-11-20: 20 mg/h via INTRAVENOUS
  Filled 2015-11-19 (×7): qty 10

## 2015-11-19 MED ORDER — HALOPERIDOL LACTATE 5 MG/ML IJ SOLN: 2.0000 mg | INTRAMUSCULAR | Status: DC

## 2015-11-19 MED ORDER — LORAZEPAM 2 MG/ML IJ SOLN
1.0000 mg | Freq: Once | INTRAMUSCULAR | Status: AC
  Administered 2015-11-19: 1 mg via INTRAVENOUS

## 2015-11-19 MED ORDER — ALBUTEROL SULFATE (2.5 MG/3ML) 0.083% IN NEBU: 2.5000 mg | INHALATION_SOLUTION | RESPIRATORY_TRACT | Status: DC | PRN

## 2015-11-19 MED ORDER — ROCURONIUM BROMIDE 50 MG/5ML IV SOLN: 30.0000 mg | Freq: Once | INTRAVENOUS | Status: DC

## 2015-11-19 MED ORDER — ENOXAPARIN SODIUM 30 MG/0.3ML ~~LOC~~ SOLN
30.0000 mg | SUBCUTANEOUS | Status: DC
  Administered 2015-11-19: 30 mg via SUBCUTANEOUS
  Filled 2015-11-19: qty 0.3

## 2015-11-19 MED ORDER — LOSARTAN POTASSIUM 50 MG PO TABS: 50.0000 mg | ORAL_TABLET | Freq: Every day | ORAL | Status: DC

## 2015-11-19 MED ORDER — SODIUM CHLORIDE 0.9 % IV SOLN: 20.0000 mg/kg | Freq: Once | INTRAVENOUS | Status: DC

## 2015-11-19 MED ORDER — SODIUM CHLORIDE 0.9 % IV SOLN
INTRAVENOUS | Status: DC
Start: 2015-11-19 — End: 2015-11-20
  Administered 2015-11-19: 19:00:00 via INTRAVENOUS

## 2015-11-19 MED ORDER — ANTISEPTIC ORAL RINSE SOLUTION (CORINZ)
7.0000 mL | OROMUCOSAL | Status: DC
  Administered 2015-11-19 – 2015-11-26 (×67): 7 mL via OROMUCOSAL

## 2015-11-19 MED ORDER — CHLORHEXIDINE GLUCONATE 0.12% ORAL RINSE (MEDLINE KIT): 15.0000 mL | Freq: Two times a day (BID) | OROMUCOSAL | Status: DC

## 2015-11-19 MED ORDER — SODIUM CHLORIDE 0.9 % IV SOLN
1000.0000 mg | Freq: Two times a day (BID) | INTRAVENOUS | Status: DC
  Administered 2015-11-19 – 2015-11-20 (×3): 1000 mg via INTRAVENOUS
  Filled 2015-11-19 (×4): qty 10

## 2015-11-19 MED ORDER — ETOMIDATE 2 MG/ML IV SOLN
16.0000 mg | Freq: Once | INTRAVENOUS | Status: AC
  Administered 2015-11-19: 16 mg via INTRAVENOUS

## 2015-11-19 MED ORDER — LEVOTHYROXINE SODIUM 50 MCG PO TABS
50.0000 ug | ORAL_TABLET | Freq: Every day | ORAL | Status: DC
  Administered 2015-11-20 – 2015-11-26 (×7): 50 ug via ORAL
  Filled 2015-11-19 (×7): qty 1

## 2015-11-19 MED ORDER — PHENYTOIN SODIUM 50 MG/ML IJ SOLN
100.0000 mg | Freq: Three times a day (TID) | INTRAMUSCULAR | Status: DC
  Administered 2015-11-19 – 2015-11-20 (×3): 100 mg via INTRAVENOUS
  Filled 2015-11-19 (×5): qty 2

## 2015-11-19 MED ORDER — CHLORHEXIDINE GLUCONATE 0.12% ORAL RINSE (MEDLINE KIT)
15.0000 mL | Freq: Two times a day (BID) | OROMUCOSAL | Status: DC
  Administered 2015-11-19 – 2015-11-26 (×14): 15 mL via OROMUCOSAL

## 2015-11-19 MED ORDER — ANTISEPTIC ORAL RINSE SOLUTION (CORINZ)
7.0000 mL | Freq: Four times a day (QID) | OROMUCOSAL | Status: DC
  Administered 2015-11-19: 7 mL via OROMUCOSAL

## 2015-11-19 MED ORDER — DOPAMINE-DEXTROSE 3.2-5 MG/ML-% IV SOLN
0.0000 ug/kg/min | INTRAVENOUS | Status: DC
  Administered 2015-11-19: 5 ug/kg/min via INTRAVENOUS
  Administered 2015-11-20: 9 ug/kg/min via INTRAVENOUS
  Administered 2015-11-21: 13 ug/kg/min via INTRAVENOUS
  Administered 2015-11-22: 20 ug/kg/min via INTRAVENOUS
  Administered 2015-11-22: 13 ug/kg/min via INTRAVENOUS
  Administered 2015-11-23: 8 ug/kg/min via INTRAVENOUS
  Filled 2015-11-19 (×6): qty 250

## 2015-11-19 MED ORDER — PROPOFOL BOLUS VIA INFUSION
1.0000 mg/kg | Freq: Once | INTRAVENOUS | Status: AC
  Administered 2015-11-19: 52.2 mg via INTRAVENOUS
  Filled 2015-11-19: qty 53

## 2015-11-19 MED ORDER — MIDAZOLAM HCL 2 MG/2ML IJ SOLN
INTRAMUSCULAR | Status: AC
Start: 2015-11-19 — End: 2015-11-20
  Filled 2015-11-19: qty 2

## 2015-11-19 MED ORDER — PROPOFOL 1000 MG/100ML IV EMUL
INTRAVENOUS | Status: AC
  Filled 2015-11-19: qty 100

## 2015-11-19 MED ORDER — MAGNESIUM OXIDE 400 (241.3 MG) MG PO TABS: 400.0000 mg | ORAL_TABLET | Freq: Every day | ORAL | Status: DC

## 2015-11-19 MED ORDER — SODIUM CHLORIDE 0.9 % IV BOLUS (SEPSIS): 1000.0000 mL | Freq: Once | INTRAVENOUS | Status: AC

## 2015-11-19 NOTE — Progress Notes (Signed)
Patient became bradycardic post-intubation and post-propofol administration. Doctor Jamison Neighbor and E-Link notified. Will continue to monitor. Ripley Fraise D

## 2015-11-19 NOTE — Procedures (Signed)
Intubation Procedure Note [place] [facility] [id number] 11-22-1925  Procedure: Intubation Indications: Airway protection and maintenance  Procedure Details Consent: Risks of procedure as well as the alternatives and risks of each were explained to the (patient/caregiver).  Consent for procedure obtained. Time Out: Verified patient identification, verified procedure, site/side was marked, verified correct patient position, special equipment/implants available, medications/allergies/relevent history reviewed, required imaging and test results available.  Performed  Maximum sterile technique was used including cap, gloves, gown, hand hygiene and mask.  4    Evaluation Hemodynamic Status: BP stable throughout; O2 sats: stable throughout Patient's Current Condition: stable Complications: No apparent complications Patient did tolerate procedure well. Chest X-ray ordered to verify placement.  CXR: pending.  Procedure performed under direct supervision of Dr. Jamison Neighbor. Glidoscope utilized for Conservator, museum/gallery [facility] ANP-BC Pulmonary and Critical Care Medicine [facility] Pager: [phone] 11/19/2015

## 2015-11-19 NOTE — Progress Notes (Signed)
eLink Physician-Brief Progress Note Patient Name: William Dougherty DOB: March 22, 1926 MRN: [id number]   Date of Service  11/19/2015  HPI/Events of Note  Sinus brady , on high dose propofol gtt Hr 30s  eICU Interventions  Lower propofol to 40 Add versed gtt upto 2/h Dc losartan     Intervention Category Major Interventions: Arrhythmia - evaluation and management  ALVA,RAKESH V. 11/19/2015, 7:15 PM

## 2015-11-19 NOTE — Progress Notes (Signed)
LTM day 1 started. Glued leads on pts head.Advised family and nurse of patient button.

## 2015-11-19 NOTE — Evaluation (Signed)
SLP Cancellation Note  Patient Details Name: William Dougherty MRN: [id number] DOB: 24-Dec-1925   Cancelled treatment:       Reason Eval/Treat Not Completed: Other (comment);Medical issues which prohibited therapy;Fatigue/lethargy limiting ability to participate   Donavan Burnet, MS [facility] SLP [phone]

## 2015-11-19 NOTE — Progress Notes (Signed)
Pt TX to 31M-01, family present & aware of TX. Bedside report.

## 2015-11-19 NOTE — Progress Notes (Signed)
Patient's HR continues to decrease. Sustaining 38-40s. Dr. Vassie Loll and Dr. Amada Jupiter made aware. Propoful decreased to 23mcg/kg. Ripley Fraise D

## 2015-11-19 NOTE — Progress Notes (Signed)
eLink Physician-Brief Progress Note Patient Name: Commodore Bellew DOB: [birth year] MRN: [id number]   Date of Service  11/19/2015  HPI/Events of Note    eICU Interventions  SCDs for DVT proph     Intervention Category Intermediate Interventions: Best-practice therapies (e.g. DVT, beta blocker, etc.)  Magalie Almon V. 11/19/2015, 6:44 PM

## 2015-11-19 NOTE — Progress Notes (Signed)
eLink Physician-Brief Progress Note Patient Name: William Dougherty DOB: 1926/06/29 MRN: [id number]   Date of Service  11/19/2015  HPI/Events of Note  Remains brady on 40 mcg  eICU Interventions  Drop to 20 mcg Atropine  Spoke to neuro -who will check for raised ICP Versed gtt ordered D/w  family     Intervention Category Major Interventions: Arrhythmia - evaluation and management  ALVA,RAKESH V. 11/19/2015, 8:11 PM

## 2015-11-19 NOTE — Progress Notes (Signed)
Patient maintaining HR at 36-37. Dr. Vassie Loll notified. New orders placed. Ripley Fraise D

## 2015-11-19 NOTE — H&P (Signed)
[facility] History and Physical  William Dougherty RUE:[id number] DOB: [birth year] DOA: 2015/11/22  Referring physician: Dr. Rennis Chris. PCP: Lolita Patella, MD  Specialists: Dr. Glenna Durand. Neurologist.  Chief Complaint: Altered mental status.  HPI: William Dougherty is a 82 y.o. male history of dementia, hypertension and hypothyroidism was brought to the ER after patient's family noticed that patient had previous episode that patient slumped to the flow volume having dinner. Following which patient was found to be in a confused and dazed state. This lasted for most 10 minutes. Patient did not have a tongue bite and patient wears diaper so it is not known if he had incontinence of urine. EMS was called and brought to the ER. It was noted the patient probably had a tonic-clonic seizure and was given midazolam. In the ER patient is found to be encephalopathic and was placed on BiPAP. ABG showed hypercarbic respiratory failure and this improved after time and BiPAP was discontinued. Patient on arrival also was hypothermic following which patient became febrile. Patient was started on empiric antibiotics by critical care for possible sepsis. Blood cultures were obtained. On my exam patient has become more alert and awake but has periods where patient has shaking of his head.   Review of Systems: As presented in the history of presenting illness, rest negative.  Past Medical History  Diagnosis Date  . Hypertension   . Hypothyroidism   . BPH (benign prostatic hyperplasia)   . Glaucoma   . GERD (gastroesophageal reflux disease)   . Knee pain, bilateral   . Bipolar 1 disorder (HCC)   . Hyperpigmentation     left maxilla  . Dementia    Past Surgical History  Procedure Laterality Date  . None     Social History:  reports that he quit smoking about 22 years ago. He has never used smokeless tobacco. He reports that he does not drink alcohol or use illicit drugs. Where does patient live  home. Can patient participate in ADLs? No.  Allergies  Allergen Reactions  . Eggs Or Egg-Derived Products Diarrhea and Nausea Only    Family History:  Family History  Problem Relation Age of Onset  . Hypothyroidism Son   . Vitiligo Son   . Hashimoto's thyroiditis Grandchild       Prior to Admission medications   Medication Sig Start Date End Date Taking? Authorizing Provider  levothyroxine (SYNTHROID, LEVOTHROID) 50 MCG tablet Take 50 mcg by mouth daily before breakfast.   Yes Historical Provider, MD  losartan-hydrochlorothiazide (HYZAAR) 50-12.5 MG per tablet Take 1 tablet by mouth every morning.   Yes Historical Provider, MD  TRAVATAN Z 0.004 % SOLN ophthalmic solution Place 1 drop into both eyes at bedtime. 10/31/15  Yes Historical Provider, MD  acetaminophen (TYLENOL) 500 MG tablet Take 2 tablets (1,000 mg total) by mouth every 6 (six) hours as needed. Patient not taking: Reported on November 22, 2015 12/29/14   Arby Barrette, MD  hydrOXYzine (ATARAX/VISTARIL) 10 MG tablet Take 1 tablet (10 mg total) by mouth at bedtime. Patient not taking: Reported on 11-22-2015 01/13/15   Asa Lente, MD    Physical Exam: Filed Vitals:   2015-11-22 2345 11/19/15 0000 11/19/15 0015 11/19/15 0105  BP: 111/92 122/69 122/71 119/66  Pulse: 59  72 84  Temp:    97.9 F (36.6 C)  TempSrc:    Axillary  Resp: 22 19 15 15   Height:    5\' 4"  (1.626 m)  Weight:    52.2 kg (115 lb 1.3 oz)  SpO2: 94%  100% 100%     General:  Moderately built and poorly nourished.  Eyes: Anicteric no pallor.  ENT: No discharge from the ears eyes nose or mouth.  Neck: No mass felt. No neck rigidity.  Cardiovascular: S1 and S2 heard.  Respiratory: No rhonchi or crepitations.  Abdomen: Soft nontender bowel sounds present.  Skin: No rash.  Musculoskeletal: Patient has some pain in the lower extremity particularly the left hip when he tries to move.  Psychiatric: Patient has dementia.  Neurologic: Alert awake but  does not answer questions at this time. Moves all extremities but has some pain on moving lower extremity.  Labs on Admission:  Basic Metabolic Panel:  Recent Labs Lab 11/10/2015 1900 10/22/2015 1906  NA 139 141  K 4.1 4.5  CL 103 104  CO2  --  24  GLUCOSE 155* 160*  BUN 34* 28*  CREATININE 1.20 1.29*  CALCIUM  --  9.9   Liver Function Tests:  Recent Labs Lab 11/11/2015 1906  AST 30  ALT 14*  ALKPHOS 52  BILITOT 0.8  PROT 6.5  ALBUMIN 3.8   No results for input(s): LIPASE, AMYLASE in the last 168 hours. No results for input(s): AMMONIA in the last 168 hours. CBC:  Recent Labs Lab 11/14/2015 1900 11/05/2015 1906  WBC  --  8.8  NEUTROABS  --  5.0  HGB 13.3 11.8*  HCT 39.0 36.6*  MCV  --  94.3  PLT  --  224   Cardiac Enzymes:  Recent Labs Lab 11/19/15 0037  TROPONINI 0.03    BNP (last 3 results) No results for input(s): BNP in the last 8760 hours.  ProBNP (last 3 results) No results for input(s): PROBNP in the last 8760 hours.  CBG: No results for input(s): GLUCAP in the last 168 hours.  Radiological Exams on Admission: Ct Head Wo Contrast  11/11/2015  CLINICAL DATA:  Advanced dementia.  Altered mental status.  Fall. EXAM: CT HEAD WITHOUT CONTRAST CT CERVICAL SPINE WITHOUT CONTRAST TECHNIQUE: Multidetector CT imaging of the head and cervical spine was performed following the standard protocol without intravenous contrast. Multiplanar CT image reconstructions of the cervical spine were also generated. COMPARISON:  01/11/2015 FINDINGS: CT HEAD FINDINGS Prominence of the sulci and ventricles are identified compatible with brain atrophy. Low attenuation within the subcortical and periventricular white matter is noted compatible with small vessel ischemic disease. There is no abnormal extra-axial fluid collection, intracranial hemorrhage or mass. No evidence for acute brain infarct. There is mucosal thickening involving the left maxillary sinus. Chronic fracture  involving the nasal bone identified. The mastoid air cells are clear. The calvarium is intact. CT CERVICAL SPINE FINDINGS The alignment of the cervical spine is normal. The vertebral body heights are well preserved. The facet joints are well aligned. No fracture identified. IMPRESSION: 1. No acute intracranial abnormalities. 2. Small vessel ischemic change and brain atrophy. 3. Cervical degenerative disc disease Electronically Signed   By: Signa Kell M.D.   On: 11/10/2015 19:56   Ct Cervical Spine Wo Contrast  10/31/2015  CLINICAL DATA:  Advanced dementia.  Altered mental status.  Fall. EXAM: CT HEAD WITHOUT CONTRAST CT CERVICAL SPINE WITHOUT CONTRAST TECHNIQUE: Multidetector CT imaging of the head and cervical spine was performed following the standard protocol without intravenous contrast. Multiplanar CT image reconstructions of the cervical spine were also generated. COMPARISON:  01/11/2015 FINDINGS: CT HEAD FINDINGS Prominence of the sulci and ventricles are identified compatible with brain atrophy. Low attenuation within  the subcortical and periventricular white matter is noted compatible with small vessel ischemic disease. There is no abnormal extra-axial fluid collection, intracranial hemorrhage or mass. No evidence for acute brain infarct. There is mucosal thickening involving the left maxillary sinus. Chronic fracture involving the nasal bone identified. The mastoid air cells are clear. The calvarium is intact. CT CERVICAL SPINE FINDINGS The alignment of the cervical spine is normal. The vertebral body heights are well preserved. The facet joints are well aligned. No fracture identified. IMPRESSION: 1. No acute intracranial abnormalities. 2. Small vessel ischemic change and brain atrophy. 3. Cervical degenerative disc disease Electronically Signed   By: Signa Kell M.D.   On: 11/02/2015 19:56   Dg Chest Port 1 View  11/02/2015  CLINICAL DATA:  Patient had underwent nodes fall to the floor from  a chair. Patient has advanced dementia. EMS reports seizure activity. EXAM: PORTABLE CHEST 1 VIEW COMPARISON:  05/08/2015 FINDINGS: Cardiac silhouette is normal in size and configuration. No mediastinal or hilar masses or evidence of adenopathy. Clear lungs. No pleural effusion or pneumothorax. Thorax is demineralized grossly intact. IMPRESSION: No acute cardiopulmonary disease. Electronically Signed   By: Amie Portland M.D.   On: 10/24/2015 19:16    EKG: Independently reviewed. Normal sinus rhythm.RBBB.  Assessment/Plan Principal Problem:   Acute encephalopathy Active Problems:   Dementia   Acute respiratory failure with hypercapnia (HCC)   Seizure (HCC)   Essential hypertension   Hypothyroidism   1. Acute encephalopathy most likely secondary to postictal state - patient still has some jerking spelled the head episodically. I have consulted on-call neurologist Dr. Noel Christmas for further recommendations. Check MRI brain and EEG. At this time patient is not placed on any antiseizure medications. 2. Acute respiratory failure with hypercarbia - improved after BiPAP. Closely observe. 3. Hypertension - since patient has mild renal failure we will hold off diuretics but continue losartan. 4. Acute renal failure - holding of diuretics. If creatinine does not improve may have to hold losartan. 5. Hypothermia on arrival and became febrile after - follow blood cultures until then patient will be on vancomycin and Zosyn. 6. Dementia.   DVT Prophylaxis Lovenox.  Code Status: Full code.  Family Communication: Patient's son.  Disposition Plan: Admit to inpatient.    KAKRAKANDY,ARSHAD N. [facility] Pager [phone].  If 7PM-7AM, please contact night-coverage [email or web address] Password [email or web address] 11/19/2015, 3:03 AM

## 2015-11-19 NOTE — Progress Notes (Signed)
Name: William Dougherty MRN: [id number] DOB: [birth year]    ADMISSION DATE:  10/28/2015   CONSULTATION DATE:  11/10/2015  REFERRING MD :  EDP  CHIEF COMPLAINT:  AMS, fall  HISTORY OF PRESENT ILLNESS:  William Dougherty is a 82 y.o. male with a PMH as outlined below.  He was in his USOH on evening of 11/16/2015 and after eating dinner, he was sitting at dinner table while family was in other room.  Family then heard a loud noise and when they came to dinner table, pt was lying on the floor.  EMS was dispatched and on en route to ED, pt had seizure like activity for 1 minute for which he was given 5mg  versed.  In ED, pt was hypothermic to 96.36F.  Initial lactate was 2.98 with repeat down to 0.96.  He had mild AKI (SCr 1.29).  ABG revealed hypoxemia; otherwise normal.   Head CT was negative for acute process.  Pt was minimally responsive however; therefore, he was placed on BiPAP.  Per notes, he remained minimally responsive; therefore, PCCM was called due to concern he may fail BiPAP.  During my exam, pt is pulling at mask and attempting to get it off.  Repeat lactate is down to 0.96.  Remaining labs essentially normal with exception of mild AKI.  Per family, pt has fairly significant dementia.  He is able to walk at home using a walker, but otherwise needs assistance with ADL's.  He is able to eat, but eats pureed food and requires assistance with feeding. Despite his dementia, family is certain that if needed, they would opt for short term life support / full code status.  SUBJECTIVE:   On 02/01 patient became obtunded after receiving keppra and versed. 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 Seen by neurology and there was concern for new onset seizures/non-convulsive status epilseptics and patient's ability to maintain airway while receiving sedating medications for seizure control. PCCM was consulted for possible intubation and airway management.   REVIEW OF SYSTEMS:   Unable to  obtain as pt is obtunded.  VITAL SIGNS: Temp:  [96.3 F (35.7 C)-100.3 F (37.9 C)] 97.6 F (36.4 C) (02/01 0700) Pulse Rate:  [59-96] 76 (02/01 0340) Resp:  [12-22] 17 (02/01 0340) BP: (102-148)/(26-92) 148/75 mmHg (02/01 0700) SpO2:  [94 %-100 %] 96 % (02/01 0340) Weight:  [115 lb 1.3 oz (52.2 kg)] 115 lb 1.3 oz (52.2 kg) (02/01 0105)  PHYSICAL EXAMINATION: General: elderly appearing male, resting in bed, in NAD. Neuro: Unresponsive to voice, touch; withdraws to noxious stimuli HEENT: PERRL, sclerae anicteric. Cardiovascular: RRR, no M/R/G, no edema  Lungs: Mouth breathing, respirations shallow and unlabored; bilateral airflow; no wheezing. Abdomen: Non-distended; soft, +BS x 4,   Musculoskeletal: No gross deformities Skin: Intact, warm, no rashes.     Recent Labs Lab 11/02/2015 1900 11/12/2015 1906  NA 139 141  K 4.1 4.5  CL 103 104  CO2  --  24  BUN 34* 28*  CREATININE 1.20 1.29*  GLUCOSE 155* 160*    Recent Labs Lab 11/14/2015 1900 11/16/2015 1906  HGB 13.3 11.8*  HCT 39.0 36.6*  WBC  --  8.8  PLT  --  224   Ct Head Wo Contrast  10/27/2015  CLINICAL DATA:  Advanced dementia.  Altered mental status.  Fall. EXAM: CT HEAD WITHOUT CONTRAST CT CERVICAL SPINE WITHOUT CONTRAST TECHNIQUE: Multidetector CT imaging of the head and cervical spine was performed following the standard protocol without intravenous contrast. Multiplanar CT  image reconstructions of the cervical spine were also generated. COMPARISON:  01/11/2015 FINDINGS: CT HEAD FINDINGS Prominence of the sulci and ventricles are identified compatible with brain atrophy. Low attenuation within the subcortical and periventricular white matter is noted compatible with small vessel ischemic disease. There is no abnormal extra-axial fluid collection, intracranial hemorrhage or mass. No evidence for acute brain infarct. There is mucosal thickening involving the left maxillary sinus. Chronic fracture involving the nasal bone  identified. The mastoid air cells are clear. The calvarium is intact. CT CERVICAL SPINE FINDINGS The alignment of the cervical spine is normal. The vertebral body heights are well preserved. The facet joints are well aligned. No fracture identified. IMPRESSION: 1. No acute intracranial abnormalities. 2. Small vessel ischemic change and brain atrophy. 3. Cervical degenerative disc disease Electronically Signed   By: Signa Kell M.D.   On: November 20, 2015 19:56   Ct Cervical Spine Wo Contrast  11-20-15  CLINICAL DATA:  Advanced dementia.  Altered mental status.  Fall. EXAM: CT HEAD WITHOUT CONTRAST CT CERVICAL SPINE WITHOUT CONTRAST TECHNIQUE: Multidetector CT imaging of the head and cervical spine was performed following the standard protocol without intravenous contrast. Multiplanar CT image reconstructions of the cervical spine were also generated. COMPARISON:  01/11/2015 FINDINGS: CT HEAD FINDINGS Prominence of the sulci and ventricles are identified compatible with brain atrophy. Low attenuation within the subcortical and periventricular white matter is noted compatible with small vessel ischemic disease. There is no abnormal extra-axial fluid collection, intracranial hemorrhage or mass. No evidence for acute brain infarct. There is mucosal thickening involving the left maxillary sinus. Chronic fracture involving the nasal bone identified. The mastoid air cells are clear. The calvarium is intact. CT CERVICAL SPINE FINDINGS The alignment of the cervical spine is normal. The vertebral body heights are well preserved. The facet joints are well aligned. No fracture identified. IMPRESSION: 1. No acute intracranial abnormalities. 2. Small vessel ischemic change and brain atrophy. 3. Cervical degenerative disc disease Electronically Signed   By: Signa Kell M.D.   On: 20-Nov-2015 19:56   Mr Brain Wo Contrast  11/19/2015  CLINICAL DATA:  Altered mental status beginning yesterday evening, witnessed seizure en route  to hospital. Hypothermia, subsequent low-grade fever. History of hypertension, dementia. EXAM: MRI HEAD WITHOUT CONTRAST TECHNIQUE: Multiplanar, multiecho pulse sequences of the brain and surrounding structures were obtained without intravenous contrast. COMPARISON:  CT head 11/20/2015 and MRI head August 20, 2013 FINDINGS: Multiple sequences are motion degraded. No reduced diffusion to suggest acute ischemia. No susceptibility artifact to suggest hemorrhage. Severe ventriculomegaly on the basis of global parenchymal brain volume loss. Disproportionate severe midbrain volume loss. RIGHT basal ganglia lacunar infarct. No midline shift, mass lesions or mass effect. Scattered subcentimeter supratentorial white matter T2 hyperintensities compatible with mild chronic small vessel ischemic disease. Bilateral holo hemispheric 2-3 mm chronic subdural hematomas, unchanged from prior MRI. Dolicoectatic intracranial vessels at the skull base. Mild paranasal sinus mucosal thickening with LEFT maxillary mucosal retention cyst. Mastoid air cells are well aerated. Craniocervical junction intact. No suspicious calvarial bone marrow signal. IMPRESSION: No acute intracranial process. Stable appearance of the head including severe global brain atrophy, with severe midbrain volume loss which can be associated with supranuclear palsy. Small bilateral chronic subdural hematomas. Electronically Signed   By: Awilda Metro M.D.   On: 11/19/2015 05:35   Dg Chest Port 1 View  11/20/2015  CLINICAL DATA:  Patient had underwent nodes fall to the floor from a chair. Patient has advanced dementia.  EMS reports seizure activity. EXAM: PORTABLE CHEST 1 VIEW COMPARISON:  05/08/2015 FINDINGS: Cardiac silhouette is normal in size and configuration. No mediastinal or hilar masses or evidence of adenopathy. Clear lungs. No pleural effusion or pneumothorax. Thorax is demineralized grossly intact. IMPRESSION: No acute cardiopulmonary disease.  Electronically Signed   By: Amie Portland M.D.   On: 11/11/2015 19:16    STUDIES:  CT head and c-spine 01/31 > no acute process.  Antibiotics Vancomycin 02/01>> Zosyn 02/01>>  Cultures 01/31 Blood>> Urine>>  SIGNIFICANT EVENTS  01/31-Found "humped over" at home>>ED> admitted with acute encephalopathy and new onset seizures. 02/01-Obtunded>>  DISCUSSION: 82 YO male with dementia presenting with new onset seizures/non-convulsive status epilepticus  ASSESSMENT / PLAN:  PULMONARY A: Acute hypoxemic respiratory failure secondary to non-convulsive status epilepticus. Possible aspiration - no hx to support; however, pt has had dysphagia for years and is unresponsvie. P: Full vent support VAP prevention Albuterol prn Empiric abx for aspiration PNA F/u CXR.    CARDIOVASCULAR A:  Hypotension H/O Hypertension P:  Hemodynamic monitoring per ICU protocol Levophed gtte and titrate to MAP goal Hold all home antihypertensives IV hydration  RENAL A:   AKI-Creatinine 1.29 P:    Monitor I/O F/U BMP Hold diuretics Monitor and replace electrolytes  GASTROINTESTINAL A:   No acute issues P:   PPI for GI prophylaxis  HEMATOLOGIC A:   No acute issues P:  Monitor CBC  INFECTIOUS A:   Low rade fever P:   F/u cultures Empiric antibiotics as above  ENDOCRINE A:   Hypothyroidism P:   Monitor glucose with BMP Synthroid at home dose  NEUROLOGIC A: New onset seizures/non-convulsive status epilepticus Acute encephalopathy 2/2 seizure activity Fall - unclear etiology-Syncope vs seizure; CT head negative and Seizure activity reported by EMS. R/O meningitis P:  RASS goal:  Neurology following Neuro checks Keppra and dilantin per neurology Per neurology, patient will need LP if febrile Propofol for burst suppression and vent sedation   FAMILY  - Updates: Family updated at bedside  - Inter-disciplinary family meet or Palliative Care meeting due by:   02/08  Magdalene [facility] ANP-BC Pulmonary and Critical Care Medicine [facility] Pager: [phone]   11/19/2015, 2:04 PM

## 2015-11-19 NOTE — Progress Notes (Addendum)
Subjective: patient is obtunded at this time. He is having intermittent left arm jerking that is rhythmic.  Son tells me he has had this prior and was awake while it was occuring but after the Versed and Keppra he has not awoken.  It is unclear if this is seizure versus sedation from medication. Will have STAT EEG to evaluate if movements are in fact seizure versus non-seizure.   Exam: Filed Vitals:   11/19/15 0340 11/19/15 0700  BP: 136/75 148/75  Pulse: 76   Temp: 99.2 F (37.3 C)   Resp: 17     HEENT-  Normocephalic, no lesions, without obvious abnormality.  Normal external eye and conjunctiva.  Normal TM's bilaterally.  Normal auditory canals and external ears. Normal external nose, mucus membranes and septum.  Normal pharynx. Cardiovascular- S1, S2 normal, pulses palpable throughout   Lungs- chest clear, no wheezing, rales, normal symmetric air entry, Heart exam - S1, S2 normal, no murmur, no gallop, rate regular Abdomen- normal findings: bowel sounds normal Extremities- no edema     Gen: In bed, NAD MS: obtunded, no response to verbal or noxious stimuli.  CN: perrla, no blink to threat, dolls intact, face symmetrical Motor: flaccid throughout with intermittent left arm jerking in flexion that will not stop when I place my hand on his arm.  Sensory: no with drawl from pain.    Pertinent Labs: STAT EEG pending MRI pending  Felicie Morn PA-C [facility] [phone]  Impression: 82 year old man with history of hypertension, hypothyroidism and dementia presenting with acute encephalopathy as well as new onset seizure activity. EEG revealed status epilepticus which has been persistent despite now three anti-epileptics(keppra, dilantin, ativan). I feel that with his current mental status, I am no longer comfortable being more aggressive without airway protection.   In this setting I have asked CCM for assistance and he has been moved to the ICU. Family is discussing  intubation for burst suppression vs. Continued treatment of partial status with current AEDs with the understanding this increases the risk of brain injury.    This patient is critically ill and at significant risk of neurological worsening, death and care requires constant monitoring of vital signs, hemodynamics,respiratory and cardiac monitoring, neurological assessment, discussion with family, other specialists and medical decision making of high complexity. I spent 90 minutes of neurocritical care time  in the care of  this patient.  Ritta Slot, MD [facility] [phone]  If 7pm- 7am, please page neurology on call as listed in AMION. 11/20/2015  6:58 PM

## 2015-11-19 NOTE — Consult Note (Signed)
Admission H&P    Chief Complaint: Altered mental status with generalized seizure.  HPI: William Dougherty is an 82 y.o. male with a history of hypertension, hypothyroidism and dementia, brought to the emergency room following onset of altered mental status at about 5 PM yesterday. Patient reportedly was staring and was unresponsive. En route to the hospital he had a witnessed generalized seizure. He was given Versed with no apparent recurrence of seizure activity. This continued to have intermittent episodes of shaking of his head and upper extremities, but is responsive to external stimuli when these episodes occur. He was hypothermic on presentation and subsequently developed a low-grade fever. Laboratory studies were unremarkable, including urinalysis and CBC. Electrolytes were unremarkable. He has no previous history of seizure activity. CT scan of his head showed no acute intracranial abnormality.  Past Medical History  Diagnosis Date  . Hypertension   . Hypothyroidism   . BPH (benign prostatic hyperplasia)   . Glaucoma   . GERD (gastroesophageal reflux disease)   . Knee pain, bilateral   . Bipolar 1 disorder ([place])   . Hyperpigmentation     left maxilla  . Dementia     Past Surgical History  Procedure Laterality Date  . None      Family History  Problem Relation Age of Onset  . Hypothyroidism Son   . Vitiligo Son   . Hashimoto's thyroiditis Grandchild    Social History:  reports that he quit smoking about 22 years ago. He has never used smokeless tobacco. He reports that he does not drink alcohol or use illicit drugs.  Allergies:  Allergies  Allergen Reactions  . Eggs Or Egg-Derived Products Diarrhea and Nausea Only    Medications Prior to Admission  Medication Sig Dispense Refill  . levothyroxine (SYNTHROID, LEVOTHROID) 50 MCG tablet Take 50 mcg by mouth daily before breakfast.    . losartan-hydrochlorothiazide (HYZAAR) 50-12.5 MG per tablet Take 1 tablet by mouth every  morning.    . TRAVATAN Z 0.004 % SOLN ophthalmic solution Place 1 drop into both eyes at bedtime.    Marland Kitchen acetaminophen (TYLENOL) 500 MG tablet Take 2 tablets (1,000 mg total) by mouth every 6 (six) hours as needed. (Patient not taking: Reported on 11/03/2015) 30 tablet 0  . hydrOXYzine (ATARAX/VISTARIL) 10 MG tablet Take 1 tablet (10 mg total) by mouth at bedtime. (Patient not taking: Reported on 10/21/2015) 30 tablet 0    ROS: History obtained from patient's son-in-law.  General ROS: As noted in present illness Psychological ROS: Chronic dementia as noted in present illness Ophthalmic ROS: negative for - blurry vision, double vision, eye pain or loss of vision ENT ROS: negative for - epistaxis, nasal discharge, oral lesions, sore throat, tinnitus or vertigo Allergy and Immunology ROS: negative for - hives or itchy/watery eyes Hematological and Lymphatic ROS: negative for - bleeding problems, bruising or swollen lymph nodes Endocrine ROS: negative for - galactorrhea, hair pattern changes, polydipsia/polyuria or temperature intolerance Respiratory ROS: negative for - cough, hemoptysis, shortness of breath or wheezing Cardiovascular ROS: negative for - chest pain, dyspnea on exertion, edema or irregular heartbeat Gastrointestinal ROS: negative for - abdominal pain, diarrhea, hematemesis, nausea/vomiting or stool incontinence Genito-Urinary ROS: negative for - dysuria, hematuria, incontinence or urinary frequency/urgency Musculoskeletal ROS: negative for - joint swelling or muscular weakness Neurological ROS: as noted in HPI Dermatological ROS: negative for rash and skin lesion changes  Physical Examination: Blood pressure 119/66, pulse 84, temperature 97.9 F (36.6 C), temperature source Axillary, resp. rate 15,  height 5' 4"  (1.626 m), weight 52.2 kg (115 lb 1.3 oz), SpO2 100 %.  HEENT-  Normocephalic, no lesions, without obvious abnormality.  Normal external eye and conjunctiva.  Normal TM's  bilaterally.  Normal auditory canals and external ears. Normal external nose, mucus membranes and septum.  Normal pharynx. Neck supple with no masses, nodes, nodules or enlargement. Cardiovascular - regular rate and rhythm, S1, S2 normal, no murmur, click, rub or gallop Lungs - chest clear, no wheezing, rales, normal symmetric air entry Abdomen - soft, non-tender; bowel sounds normal; no masses,  no organomegaly Extremities - no joint deformities, effusion, or inflammation and no edema  Neurologic Examination: Patient was lethargic but arousable. Speech was moderately garbled. Right pupil was normal in size and reacted normally to light. Left pupil was irregular in shape and larger than the right, and did not react to light. Extraocular movements were full and conjugate on right left lateral gaze. Face was symmetrical no focal weakness. Motor exam showed increased tone throughout, right slightly greater than left. Upper extremities were kept in a flexed posture. He had intermittent brief shaking movements of upper extremities and head, and was responsive during these spells. Deep tendon reflexes were 2+ and symmetrical. Plantar responses were mute.  Results for orders placed or performed during the hospital encounter of 10/29/2015 (from the past 48 hour(s))  I-Stat venous blood gas, ED ([facility], [facility])     Status: Abnormal   Collection Time: 11/17/2015  6:53 PM  Result Value Ref Range   pH, Ven 7.215 (L) 7.250 - 7.300   pCO2, Ven 64.3 (H) 45.0 - 50.0 mmHg   pO2, Ven 60.0 (H) 30.0 - 45.0 mmHg   Bicarbonate 26.0 (H) 20.0 - 24.0 mEq/L   TCO2 28 0 - 100 mmol/L   O2 Saturation 84.0 %   Acid-base deficit 3.0 (H) 0.0 - 2.0 mmol/L   Patient temperature HIDE    Sample type VENOUS    Comment NOTIFIED PHYSICIAN   I-stat troponin, ED (not at [facility])     Status: None   Collection Time: 11/05/2015  6:58 PM  Result Value Ref Range   Troponin i, poc 0.00 0.00 - 0.08 ng/mL   Comment 3            Comment: Due  to the release kinetics of cTnI, a negative result within the first hours of the onset of symptoms does not rule out myocardial infarction with certainty. If myocardial infarction is still suspected, repeat the test at appropriate intervals.   I-Stat CG4 Lactic Acid, ED  (not at  [facility])     Status: Abnormal   Collection Time: 10/24/2015  7:00 PM  Result Value Ref Range   Lactic Acid, Venous 2.98 (HH) 0.5 - 2.0 mmol/L   Comment NOTIFIED PHYSICIAN   I-stat Chem 8, ED     Status: Abnormal   Collection Time: 10/27/2015  7:00 PM  Result Value Ref Range   Sodium 139 135 - 145 mmol/L   Potassium 4.1 3.5 - 5.1 mmol/L   Chloride 103 101 - 111 mmol/L   BUN 34 (H) 6 - 20 mg/dL   Creatinine, Ser 1.20 0.61 - 1.24 mg/dL   Glucose, Bld 155 (H) 65 - 99 mg/dL   Calcium, Ion 1.26 1.13 - 1.30 mmol/L   TCO2 25 0 - 100 mmol/L   Hemoglobin 13.3 13.0 - 17.0 g/dL   HCT 39.0 39.0 - 52.0 %  Comprehensive metabolic panel     Status: Abnormal  Collection Time: 11/03/2015  7:06 PM  Result Value Ref Range   Sodium 141 135 - 145 mmol/L   Potassium 4.5 3.5 - 5.1 mmol/L   Chloride 104 101 - 111 mmol/L   CO2 24 22 - 32 mmol/L   Glucose, Bld 160 (H) 65 - 99 mg/dL   BUN 28 (H) 6 - 20 mg/dL   Creatinine, Ser 1.29 (H) 0.61 - 1.24 mg/dL   Calcium 9.9 8.9 - 10.3 mg/dL   Total Protein 6.5 6.5 - 8.1 g/dL   Albumin 3.8 3.5 - 5.0 g/dL   AST 30 15 - 41 U/L   ALT 14 (L) 17 - 63 U/L   Alkaline Phosphatase 52 38 - 126 U/L   Total Bilirubin 0.8 0.3 - 1.2 mg/dL   GFR calc non Af Amer 48 (L) >60 mL/min   GFR calc Af Amer 56 (L) >60 mL/min    Comment: (NOTE) The eGFR has been calculated using the CKD EPI equation. This calculation has not been validated in all clinical situations. eGFR's persistently <60 mL/min signify possible Chronic Kidney Disease.    Anion gap 13 5 - 15  CBC WITH DIFFERENTIAL     Status: Abnormal   Collection Time: 11/06/2015  7:06 PM  Result Value Ref Range   WBC 8.8 4.0 - 10.5 K/uL   RBC 3.88 (L)  4.22 - 5.81 MIL/uL   Hemoglobin 11.8 (L) 13.0 - 17.0 g/dL   HCT 36.6 (L) 39.0 - 52.0 %   MCV 94.3 78.0 - 100.0 fL   MCH 30.4 26.0 - 34.0 pg   MCHC 32.2 30.0 - 36.0 g/dL   RDW 14.1 11.5 - 15.5 %   Platelets 224 150 - 400 K/uL   Neutrophils Relative % 57 %   Neutro Abs 5.0 1.7 - 7.7 K/uL   Lymphocytes Relative 34 %   Lymphs Abs 3.0 0.7 - 4.0 K/uL   Monocytes Relative 7 %   Monocytes Absolute 0.6 0.1 - 1.0 K/uL   Eosinophils Relative 2 %   Eosinophils Absolute 0.2 0.0 - 0.7 K/uL   Basophils Relative 0 %   Basophils Absolute 0.0 0.0 - 0.1 K/uL  Protime-INR     Status: None   Collection Time: 11/02/2015  7:06 PM  Result Value Ref Range   Prothrombin Time 14.8 11.6 - 15.2 seconds   INR 1.14 0.00 - 1.49  I-Stat arterial blood gas, ED     Status: Abnormal   Collection Time: 11/07/2015  7:54 PM  Result Value Ref Range   pH, Arterial 7.353 7.350 - 7.450   pCO2 arterial 43.4 35.0 - 45.0 mmHg   pO2, Arterial 58.0 (L) 80.0 - 100.0 mmHg   Bicarbonate 24.4 (H) 20.0 - 24.0 mEq/L   TCO2 26 0 - 100 mmol/L   O2 Saturation 90.0 %   Acid-base deficit 2.0 0.0 - 2.0 mmol/L   Patient temperature 96.3 F    Collection site RADIAL, ALLEN'S TEST ACCEPTABLE    Drawn by Operator    Sample type ARTERIAL   I-Stat Troponin, ED (not at [facility])     Status: None   Collection Time: 10/29/2015  9:20 PM  Result Value Ref Range   Troponin i, poc 0.00 0.00 - 0.08 ng/mL   Comment 3            Comment: Due to the release kinetics of cTnI, a negative result within the first hours of the onset of symptoms does not rule out myocardial infarction with certainty. If  myocardial infarction is still suspected, repeat the test at appropriate intervals.   I-Stat CG4 Lactic Acid, ED     Status: None   Collection Time: 11/15/2015  9:23 PM  Result Value Ref Range   Lactic Acid, Venous 0.96 0.5 - 2.0 mmol/L  Urinalysis, Routine w reflex microscopic (not at [facility])     Status: None   Collection Time: 11/05/2015  9:31 PM  Result Value  Ref Range   Color, Urine YELLOW YELLOW   APPearance CLEAR CLEAR   Specific Gravity, Urine 1.024 1.005 - 1.030   pH 6.0 5.0 - 8.0   Glucose, UA NEGATIVE NEGATIVE mg/dL   Hgb urine dipstick NEGATIVE NEGATIVE   Bilirubin Urine NEGATIVE NEGATIVE   Ketones, ur NEGATIVE NEGATIVE mg/dL   Protein, ur NEGATIVE NEGATIVE mg/dL   Nitrite NEGATIVE NEGATIVE   Leukocytes, UA NEGATIVE NEGATIVE    Comment: MICROSCOPIC NOT DONE ON URINES WITH NEGATIVE PROTEIN, BLOOD, LEUKOCYTES, NITRITE, OR GLUCOSE <1000 mg/dL.  I-Stat CG4 Lactic Acid, ED  (not at  [facility])     Status: None   Collection Time: 11/19/15 12:37 AM  Result Value Ref Range   Lactic Acid, Venous 1.31 0.5 - 2.0 mmol/L  Procalcitonin - Baseline     Status: None   Collection Time: 11/19/15 12:37 AM  Result Value Ref Range   Procalcitonin <0.10 ng/mL    Comment:        Interpretation: PCT (Procalcitonin) <= 0.5 ng/mL: Systemic infection (sepsis) is not likely. Local bacterial infection is possible. (NOTE)         ICU PCT Algorithm               Non ICU PCT Algorithm    ----------------------------     ------------------------------         PCT < 0.25 ng/mL                 PCT < 0.1 ng/mL     Stopping of antibiotics            Stopping of antibiotics       strongly encouraged.               strongly encouraged.    ----------------------------     ------------------------------       PCT level decrease by               PCT < 0.25 ng/mL       >= 80% from peak PCT       OR PCT 0.25 - 0.5 ng/mL          Stopping of antibiotics                                             encouraged.     Stopping of antibiotics           encouraged.    ----------------------------     ------------------------------       PCT level decrease by              PCT >= 0.25 ng/mL       < 80% from peak PCT        AND PCT >= 0.5 ng/mL            Continuin g antibiotics  encouraged.       Continuing antibiotics             encouraged.    ----------------------------     ------------------------------     PCT level increase compared          PCT > 0.5 ng/mL         with peak PCT AND          PCT >= 0.5 ng/mL             Escalation of antibiotics                                          strongly encouraged.      Escalation of antibiotics        strongly encouraged.   Troponin I     Status: None   Collection Time: 11/19/15 12:37 AM  Result Value Ref Range   Troponin I 0.03 <0.031 ng/mL    Comment:        NO INDICATION OF MYOCARDIAL INJURY.    Ct Head Wo Contrast  10/23/2015  CLINICAL DATA:  Advanced dementia.  Altered mental status.  Fall. EXAM: CT HEAD WITHOUT CONTRAST CT CERVICAL SPINE WITHOUT CONTRAST TECHNIQUE: Multidetector CT imaging of the head and cervical spine was performed following the standard protocol without intravenous contrast. Multiplanar CT image reconstructions of the cervical spine were also generated. COMPARISON:  01/11/2015 FINDINGS: CT HEAD FINDINGS Prominence of the sulci and ventricles are identified compatible with brain atrophy. Low attenuation within the subcortical and periventricular white matter is noted compatible with small vessel ischemic disease. There is no abnormal extra-axial fluid collection, intracranial hemorrhage or mass. No evidence for acute brain infarct. There is mucosal thickening involving the left maxillary sinus. Chronic fracture involving the nasal bone identified. The mastoid air cells are clear. The calvarium is intact. CT CERVICAL SPINE FINDINGS The alignment of the cervical spine is normal. The vertebral body heights are well preserved. The facet joints are well aligned. No fracture identified. IMPRESSION: 1. No acute intracranial abnormalities. 2. Small vessel ischemic change and brain atrophy. 3. Cervical degenerative disc disease Electronically Signed   By: Kerby Moors M.D.   On: 10/29/2015 19:56   Ct Cervical Spine Wo Contrast  11/06/2015  CLINICAL DATA:   Advanced dementia.  Altered mental status.  Fall. EXAM: CT HEAD WITHOUT CONTRAST CT CERVICAL SPINE WITHOUT CONTRAST TECHNIQUE: Multidetector CT imaging of the head and cervical spine was performed following the standard protocol without intravenous contrast. Multiplanar CT image reconstructions of the cervical spine were also generated. COMPARISON:  01/11/2015 FINDINGS: CT HEAD FINDINGS Prominence of the sulci and ventricles are identified compatible with brain atrophy. Low attenuation within the subcortical and periventricular white matter is noted compatible with small vessel ischemic disease. There is no abnormal extra-axial fluid collection, intracranial hemorrhage or mass. No evidence for acute brain infarct. There is mucosal thickening involving the left maxillary sinus. Chronic fracture involving the nasal bone identified. The mastoid air cells are clear. The calvarium is intact. CT CERVICAL SPINE FINDINGS The alignment of the cervical spine is normal. The vertebral body heights are well preserved. The facet joints are well aligned. No fracture identified. IMPRESSION: 1. No acute intracranial abnormalities. 2. Small vessel ischemic change and brain atrophy. 3. Cervical degenerative disc disease Electronically Signed   By: Kerby Moors M.D.   On: 10/20/2015 19:56  Dg Chest Port 1 View  10/22/2015  CLINICAL DATA:  Patient had underwent nodes fall to the floor from a chair. Patient has advanced dementia. EMS reports seizure activity. EXAM: PORTABLE CHEST 1 VIEW COMPARISON:  05/08/2015 FINDINGS: Cardiac silhouette is normal in size and configuration. No mediastinal or hilar masses or evidence of adenopathy. Clear lungs. No pleural effusion or pneumothorax. Thorax is demineralized grossly intact. IMPRESSION: No acute cardiopulmonary disease. Electronically Signed   By: Lajean Manes M.D.   On: 10/23/2015 19:16    Assessment/Plan 82 year old man with history of hypertension, hypothyroidism and dementia  presenting with acute encephalopathy as well as new onset seizure activity. Etiology is unclear. Infectious process is suspected. Acute cerebral infarction cannot be ruled out at this point.  Recommendations: 1. MRI of the brain without contrast, as planned 2. EEG, routine about study later today as planned 3. Keppra 1000 mg IV loading dose followed by every 12 hours for maintenance  We will continue to follow this patient with you.  C.R. Nicole Kindred, MD [facility] [phone]  11/19/2015, 3:43 AM

## 2015-11-19 NOTE — Progress Notes (Addendum)
8:04 AM I agree with HPI/GPe and A/P per Dr. Toniann Fail  82 y/o ? Dementia MMSE 14/30 in 08/2015-dependant on ADL's Htn Hypothyroidism  Admitted with AMS and staring /non-responsive 11/19/15 found to be hypothermic etc  MRI/EEG pending Seen by Neuro  Loaded Keppra    Patient's son is seated at bedside multiple concerns Concern regarding need for Keppra versus antibiotics and whether patient is having continued seizures All labs as well as imaging reviewed with patient's family Await patient awakening as had not slept properly the day before. Noted at bedside patient shaking left upper extremity periodically without any generalized signs or symptoms of seizure    Patient Active Problem List   Diagnosis Date Noted  . Sepsis (HCC) 11/19/2015  . Acute encephalopathy 11/19/2015  . Seizure (HCC) 11/19/2015  . Essential hypertension 11/19/2015  . Hypothyroidism 11/19/2015  . Acute respiratory failure with hypercapnia (HCC)   . Respiratory failure (HCC)   . Altered mental status   . Failure to thrive in adult   . Fall   . Sialorrhea 08/28/2015  . Dementia 08/28/2013  . Memory loss 08/13/2013  . Abnormality of gait 08/13/2013  . Knee pain 08/13/2013  . Incontinence 08/13/2013  . B12 deficiency 08/13/2013    Await EEG Monitor on step down today Replace magnesium orally if awake enough later today Keep on telemetry  Potential discharge in a.m. if EEG negative and no other findings suggestive infection-follow urine culture and blood cultures 2  Hold off on palliative consult for now

## 2015-11-19 NOTE — Care Management Note (Signed)
Case Management Note  Patient Details  Name: Jayquon Theiler MRN: [id number] Date of Birth: [birth year]  Subjective/Objective:      Date: 11/19/15 Spoke with patient's daughter n law who lives with him, patient unable to be involved in conversation.  Introduced self as Sports coach and explained role in discharge planning and how to be reached.  Verified patient lives in town, with son and daughter n  Conni Elliot.  They do everything for patient, dress him, feed him toilet him etc,he it total care and someone is with him at all times.   Has rolling  walker at home, that daughter n law said he was using.  Expressed potential need for no other DME at this time.  Daughter n law states in their culture they try to keep family members at home and to do  the best they can for them , patient is being transferred to ICU today, will continue to monitor progress .   Plan: CM will continue to follow for discharge planning and [facility] resources.               Action/Plan:   Expected Discharge Date:                  Expected Discharge Plan:  Skilled Nursing Facility  In-House Referral:     Discharge planning Services  CM Consult  Post Acute Care Choice:    Choice offered to:     DME Arranged:    DME Agency:     HH Arranged:    HH Agency:     Status of Service:  In process, will continue to follow  Medicare Important Message Given:    Date Medicare IM Given:    Medicare IM give by:    Date Additional Medicare IM Given:    Additional Medicare Important Message give by:     If discussed at Long Length of Stay Meetings, dates discussed:    Additional Comments:  Leone Haven, RN 11/19/2015, 2:07 PM

## 2015-11-19 NOTE — Telephone Encounter (Signed)
Ayesha Mohair [phone], called to advise Dr. Terrace Arabia, that patient is in the hospital, slumped over and was on the floor yesterday, became stiff and unresponsive, had seizure in ambulance, they're doing tests and EEG, still having seizures.

## 2015-11-19 NOTE — ED Notes (Signed)
Family member requested that we do not do another chest xray tonight.  Order canceled

## 2015-11-19 DEATH — deceased

## 2015-11-20 ENCOUNTER — Inpatient Hospital Stay (HOSPITAL_COMMUNITY): Payer: Medicare Other

## 2015-11-20 DIAGNOSIS — G40209 Localization-related (focal) (partial) symptomatic epilepsy and epileptic syndromes with complex partial seizures, not intractable, without status epilepticus: Secondary | ICD-10-CM | POA: Insufficient documentation

## 2015-11-20 DIAGNOSIS — G40901 Epilepsy, unspecified, not intractable, with status epilepticus: Secondary | ICD-10-CM | POA: Insufficient documentation

## 2015-11-20 LAB — BASIC METABOLIC PANEL
ANION GAP: 8 (ref 5–15)
BUN: 20 mg/dL (ref 6–20)
CHLORIDE: 106 mmol/L (ref 101–111)
CO2: 23 mmol/L (ref 22–32)
Calcium: 8.9 mg/dL (ref 8.9–10.3)
Creatinine, Ser: 1.28 mg/dL — ABNORMAL HIGH (ref 0.61–1.24)
GFR calc non Af Amer: 49 mL/min — ABNORMAL LOW (ref >60)
GFR, EST AFRICAN AMERICAN: 56 mL/min — AB (ref >60)
Glucose, Bld: 151 mg/dL — ABNORMAL HIGH (ref 65–99)
POTASSIUM: 3.7 mmol/L (ref 3.5–5.1)
SODIUM: 137 mmol/L (ref 135–145)

## 2015-11-20 LAB — CBC WITH DIFFERENTIAL/PLATELET
Basophils Absolute: 0 10*3/uL (ref 0.0–0.1)
Basophils Relative: 0 %
Eosinophils Absolute: 0.2 10*3/uL (ref 0.0–0.7)
Eosinophils Relative: 3 %
HEMATOCRIT: 38.4 % — AB (ref 39.0–52.0)
HEMOGLOBIN: 12.9 g/dL — AB (ref 13.0–17.0)
LYMPHS ABS: 1.3 10*3/uL (ref 0.7–4.0)
LYMPHS PCT: 14 %
MCH: 31 pg (ref 26.0–34.0)
MCHC: 33.6 g/dL (ref 30.0–36.0)
MCV: 92.3 fL (ref 78.0–100.0)
Monocytes Absolute: 0.8 10*3/uL (ref 0.1–1.0)
Monocytes Relative: 9 %
NEUTROS ABS: 6.8 10*3/uL (ref 1.7–7.7)
NEUTROS PCT: 75 %
Platelets: 208 10*3/uL (ref 150–400)
RBC: 4.16 MIL/uL — AB (ref 4.22–5.81)
RDW: 13.9 % (ref 11.5–15.5)
WBC: 9.2 10*3/uL (ref 4.0–10.5)

## 2015-11-20 LAB — PHOSPHORUS: Phosphorus: 2.6 mg/dL (ref 2.5–4.6)

## 2015-11-20 LAB — PROTEIN AND GLUCOSE, CSF
GLUCOSE CSF: 83 mg/dL — AB (ref 40–70)
Total  Protein, CSF: 52 mg/dL — ABNORMAL HIGH (ref 15–45)

## 2015-11-20 LAB — PROCALCITONIN: Procalcitonin: 0.15 ng/mL

## 2015-11-20 LAB — PHENYTOIN LEVEL, TOTAL: Phenytoin Lvl: 23.3 ug/mL — ABNORMAL HIGH (ref 10.0–20.0)

## 2015-11-20 LAB — GLUCOSE, CAPILLARY: GLUCOSE-CAPILLARY: 174 mg/dL — AB (ref 65–99)

## 2015-11-20 LAB — MAGNESIUM: Magnesium: 1.5 mg/dL — ABNORMAL LOW (ref 1.7–2.4)

## 2015-11-20 LAB — TRIGLYCERIDES: Triglycerides: 53 mg/dL (ref <150)

## 2015-11-20 MED ORDER — MIDAZOLAM BOLUS VIA INFUSION
15.0000 mg | Freq: Once | INTRAVENOUS | Status: AC
  Administered 2015-11-20: 15 mg via INTRAVENOUS
  Filled 2015-11-20: qty 15

## 2015-11-20 MED ORDER — MIDAZOLAM BOLUS VIA INFUSION: 10.0000 mg | Freq: Once | INTRAVENOUS | Status: DC

## 2015-11-20 MED ORDER — VITAL HIGH PROTEIN PO LIQD
1000.0000 mL | ORAL | Status: DC
  Administered 2015-11-20: 1000 mL
  Administered 2015-11-20: 17:00:00
  Administered 2015-11-21 – 2015-11-23 (×4): 1000 mL

## 2015-11-20 MED ORDER — MIDAZOLAM HCL 5 MG/ML IJ SOLN
20.0000 mg/h | INTRAMUSCULAR | Status: DC
  Administered 2015-11-20 – 2015-11-21 (×3): 20 mg/h via INTRAVENOUS
  Filled 2015-11-20 (×3): qty 20

## 2015-11-20 MED ORDER — LACOSAMIDE 200 MG/20ML IV SOLN
200.0000 mg | Freq: Two times a day (BID) | INTRAVENOUS | Status: DC
  Administered 2015-11-20 – 2015-11-26 (×13): 200 mg via INTRAVENOUS
  Filled 2015-11-20 (×25): qty 20

## 2015-11-20 MED ORDER — SODIUM CHLORIDE 0.9 % IV BOLUS (SEPSIS)
500.0000 mL | Freq: Once | INTRAVENOUS | Status: AC
  Administered 2015-11-20: 500 mL via INTRAVENOUS

## 2015-11-20 MED ORDER — LEVETIRACETAM 500 MG/5ML IV SOLN
500.0000 mg | Freq: Once | INTRAVENOUS | Status: AC
  Administered 2015-11-20: 500 mg via INTRAVENOUS
  Filled 2015-11-20: qty 5

## 2015-11-20 MED ORDER — SODIUM CHLORIDE 0.9 % IV SOLN
1500.0000 mg | Freq: Two times a day (BID) | INTRAVENOUS | Status: DC
Start: 2015-11-20 — End: 2015-11-22
  Administered 2015-11-20 – 2015-11-22 (×4): 1500 mg via INTRAVENOUS
  Filled 2015-11-20 (×4): qty 15

## 2015-11-20 MED ORDER — SODIUM CHLORIDE 0.9 % IV BOLUS (SEPSIS)
250.0000 mL | Freq: Once | INTRAVENOUS | Status: AC
  Administered 2015-11-20: 250 mL via INTRAVENOUS

## 2015-11-20 MED ORDER — PHENYTOIN SODIUM 50 MG/ML IJ SOLN
75.0000 mg | Freq: Three times a day (TID) | INTRAMUSCULAR | Status: DC
  Administered 2015-11-20 – 2015-11-24 (×11): 75 mg via INTRAVENOUS
  Filled 2015-11-20 (×11): qty 2

## 2015-11-20 MED ORDER — MAGNESIUM SULFATE 2 GM/50ML IV SOLN
2.0000 g | Freq: Once | INTRAVENOUS | Status: AC
  Administered 2015-11-20: 2 g via INTRAVENOUS
  Filled 2015-11-20: qty 50

## 2015-11-20 NOTE — Procedures (Signed)
Indication: 82 yo F with refractory status epilepticus  Risks of the procedure were dicussed with the patient including post-LP headache, bleeding, infection, weakness/numbness of legs(radiculopathy), death.  The patient/patient's proxy agreed and written consent was obtained.   The patient was prepped and draped, and using sterile technique a 20 gauge quinke spinal needle was inserted in the L4-5 space. The opening pressure was 6 cm H2O. Approximately 7 cc of CSF were obtained and sent for analysis.   Ritta Slot, MD [facility] [phone]  If 7pm- 7am, please page neurology on call as listed in AMION.

## 2015-11-20 NOTE — Progress Notes (Signed)
Name: William Dougherty MRN: [id number] DOB: 05-Jan-1926    ADMISSION DATE:  Dec 18, 2015   CONSULTATION DATE:  12/18/15  REFERRING MD :  EDP  CHIEF COMPLAINT:  AMS, fall  HISTORY OF PRESENT ILLNESS:  William Dougherty is a 82 y.o. male with a PMH as outlined below.  He was in his USOH on evening of 12-18-15 and after eating dinner, he was sitting at dinner table while family was in other room.  Family then heard a loud noise and when they came to dinner table, pt was lying on the floor.  EMS was dispatched and on en route to ED, pt had seizure like activity for 1 minute for which he was given  versed.  In ED, pt was hypothermic to 96.75F.  Initial lactate was 2.98 with repeat down to 0.96.  He had mild AKI (SCr 1.29).  ABG revealed hypoxemia; otherwise normal.   Head CT was negative for acute process.  Pt was minimally responsive however; therefore, he was placed on BiPAP.  Per notes, he remained minimally responsive; therefore, PCCM was called due to concern he may fail BiPAP.  During my exam, pt is pulling at mask and attempting to get it off.  Repeat lactate is down to 0.96.  Remaining labs essentially normal with exception of mild AKI.  Per family, pt has fairly significant dementia.  He is able to walk at home using a walker, but otherwise needs assistance with ADL's.  He is able to eat, but eats pureed food and requires assistance with feeding. Despite his dementia, family is certain that if needed, they would opt for short term life support / full code status.  SUBJECTIVE:   On 02/01 patient became obtunded after receiving keppra and versed. 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 Seen by neurology and there was concern for new onset seizures/non-convulsive status epilseptics and patient's ability to maintain airway while receiving sedating medications for seizure control. PCCM was consulted for intubation and airway management.   REVIEW OF SYSTEMS:   Unable to obtain  as pt is obtunded.  VITAL SIGNS: Temp:  [93.5 F (34.2 C)-99.7 F (37.6 C)] 97.7 F (36.5 C) (02/02 0811) Pulse Rate:  [32-95] 85 (02/02 0810) Resp:  [12-19] 14 (02/02 0813) BP: (55-187)/(40-93) 137/69 mmHg (02/02 0813) SpO2:  [96 %-100 %] 100 % (02/02 0810) FiO2 (%):  [40 %-100 %] 40 % (02/02 0334)  PHYSICAL EXAMINATION: General: Deep sedation, unresponsive.  Neuro: No response HEENT: PERRL, sclerae anicteric. Moist mucus membranes Cardiovascular: RRR, no MRG, no edema  Lungs: No wheezing, crackles.  Abdomen: Non-distended; soft, +BS Musculoskeletal: No gross deformities Skin: No rashes.    Recent Labs Lab 2015/12/18 1900 2015/12/18 1906 11/20/15 0400  NA 139 141 137  K 4.1 4.5 3.7  CL 103 104 106  CO2  --  24 23  BUN 34* 28* 20  CREATININE 1.20 1.29* 1.28*  GLUCOSE 155* 160* 151*    Recent Labs Lab 12-18-2015 1900 18-Dec-2015 1906 11/20/15 0400  HGB 13.3 11.8* 12.9*  HCT 39.0 36.6* 38.4*  WBC  --  8.8 9.2  PLT  --  224 208   Ct Head Wo Contrast  2015/12/18  CLINICAL DATA:  Advanced dementia.  Altered mental status.  Fall. EXAM: CT HEAD WITHOUT CONTRAST CT CERVICAL SPINE WITHOUT CONTRAST TECHNIQUE: Multidetector CT imaging of the head and cervical spine was performed following the standard protocol without intravenous contrast. Multiplanar CT image reconstructions of the cervical spine were also generated. COMPARISON:  01/11/2015  FINDINGS: CT HEAD FINDINGS Prominence of the sulci and ventricles are identified compatible with brain atrophy. Low attenuation within the subcortical and periventricular white matter is noted compatible with small vessel ischemic disease. There is no abnormal extra-axial fluid collection, intracranial hemorrhage or mass. No evidence for acute brain infarct. There is mucosal thickening involving the left maxillary sinus. Chronic fracture involving the nasal bone identified. The mastoid air cells are clear. The calvarium is intact. CT CERVICAL SPINE  FINDINGS The alignment of the cervical spine is normal. The vertebral body heights are well preserved. The facet joints are well aligned. No fracture identified. IMPRESSION: 1. No acute intracranial abnormalities. 2. Small vessel ischemic change and brain atrophy. 3. Cervical degenerative disc disease Electronically Signed   By: Signa Kell M.D.   On: 12-14-2015 19:56   Ct Cervical Spine Wo Contrast  12/14/15  CLINICAL DATA:  Advanced dementia.  Altered mental status.  Fall. EXAM: CT HEAD WITHOUT CONTRAST CT CERVICAL SPINE WITHOUT CONTRAST TECHNIQUE: Multidetector CT imaging of the head and cervical spine was performed following the standard protocol without intravenous contrast. Multiplanar CT image reconstructions of the cervical spine were also generated. COMPARISON:  01/11/2015 FINDINGS: CT HEAD FINDINGS Prominence of the sulci and ventricles are identified compatible with brain atrophy. Low attenuation within the subcortical and periventricular white matter is noted compatible with small vessel ischemic disease. There is no abnormal extra-axial fluid collection, intracranial hemorrhage or mass. No evidence for acute brain infarct. There is mucosal thickening involving the left maxillary sinus. Chronic fracture involving the nasal bone identified. The mastoid air cells are clear. The calvarium is intact. CT CERVICAL SPINE FINDINGS The alignment of the cervical spine is normal. The vertebral body heights are well preserved. The facet joints are well aligned. No fracture identified. IMPRESSION: 1. No acute intracranial abnormalities. 2. Small vessel ischemic change and brain atrophy. 3. Cervical degenerative disc disease Electronically Signed   By: Signa Kell M.D.   On: December 14, 2015 19:56   Mr Brain Wo Contrast  11/19/2015  CLINICAL DATA:  Altered mental status beginning yesterday evening, witnessed seizure en route to hospital. Hypothermia, subsequent low-grade fever. History of hypertension,  dementia. EXAM: MRI HEAD WITHOUT CONTRAST TECHNIQUE: Multiplanar, multiecho pulse sequences of the brain and surrounding structures were obtained without intravenous contrast. COMPARISON:  CT head Dec 14, 2015 and MRI head August 20, 2013 FINDINGS: Multiple sequences are motion degraded. No reduced diffusion to suggest acute ischemia. No susceptibility artifact to suggest hemorrhage. Severe ventriculomegaly on the basis of global parenchymal brain volume loss. Disproportionate severe midbrain volume loss. RIGHT basal ganglia lacunar infarct. No midline shift, mass lesions or mass effect. Scattered subcentimeter supratentorial white matter T2 hyperintensities compatible with mild chronic small vessel ischemic disease. Bilateral holo hemispheric 2-3 mm chronic subdural hematomas, unchanged from prior MRI. Dolicoectatic intracranial vessels at the skull base. Mild paranasal sinus mucosal thickening with LEFT maxillary mucosal retention cyst. Mastoid air cells are well aerated. Craniocervical junction intact. No suspicious calvarial bone marrow signal. IMPRESSION: No acute intracranial process. Stable appearance of the head including severe global brain atrophy, with severe midbrain volume loss which can be associated with supranuclear palsy. Small bilateral chronic subdural hematomas. Electronically Signed   By: Awilda Metro M.D.   On: 11/19/2015 05:35   Portable Chest Xray  11/20/2015  CLINICAL DATA:  82 year old male with respiratory failure. Recent fall and seizure activity. Initial encounter. EXAM: PORTABLE CHEST 1 VIEW COMPARISON:  11/19/2015 and earlier. FINDINGS: Portable AP semi upright view at  0606 hours. The patient is more rotated to the left. Endotracheal tube tip is stable between the a clavicles and carina. Lung parenchyma appears clear. No pneumothorax or pleural effusion. Stable cardiac size and mediastinal contours. Calcified aortic atherosclerosis. IMPRESSION: Stable endotracheal tube.  No  acute cardiopulmonary abnormality. Electronically Signed   By: Odessa Fleming M.D.   On: 11/20/2015 07:35   Dg Chest Port 1 View  11/19/2015  CLINICAL DATA:  Intubation. EXAM: PORTABLE CHEST 1 VIEW COMPARISON:  12/05/2015 FINDINGS: Endotracheal tube tip is 5.1 cm above the base of the carina. Lungs are hyperexpanded. Opacity in the left apex likely related to a confluence of shadows given the appearance of this region on the x-ray yesterday. The cardiopericardial silhouette is within normal limits for size. Telemetry leads overlie the chest. IMPRESSION: Endotracheal tube tip is 5 cm above the base of the carina. Electronically Signed   By: Kennith Center M.D.   On: 11/19/2015 16:47   Dg Chest Port 1 View  12-05-15  CLINICAL DATA:  Patient had underwent nodes fall to the floor from a chair. Patient has advanced dementia. EMS reports seizure activity. EXAM: PORTABLE CHEST 1 VIEW COMPARISON:  05/08/2015 FINDINGS: Cardiac silhouette is normal in size and configuration. No mediastinal or hilar masses or evidence of adenopathy. Clear lungs. No pleural effusion or pneumothorax. Thorax is demineralized grossly intact. IMPRESSION: No acute cardiopulmonary disease. Electronically Signed   By: Amie Portland M.D.   On: 05-Dec-2015 19:16    STUDIES:  CT head and c-spine 01/31 > no acute process.  Antibiotics Vancomycin 02/01>> Zosyn 02/01>>  Cultures 01/31 Blood>> Urine>>  SIGNIFICANT EVENTS  01/31-Found "humped over" at home>>ED> admitted with acute encephalopathy and new onset seizures. 02/01-Obtunded>>  DISCUSSION: 82 YO male with dementia presenting with new onset seizures/non-convulsive status epilepticus  ASSESSMENT / PLAN:  PULMONARY A: Acute hypoxemic respiratory failure secondary to non-convulsive status epilepticus. Possible aspiration - no hx to support; however, pt has had dysphagia for years and is unresponsvie. P: Full vent support VAP prevention Albuterol prn Empiric abx for  aspiration PNA Check procalcitonin    CARDIOVASCULAR A:  Hypotension H/O Hypertension P:  Hemodynamic monitoring per ICU protocol Levophed gtte and titrate to MAP goal Hold all home antihypertensives IV hydration  RENAL A:   AKI-Creatinine 1.29 P:    Monitor I/O F/U BMP Hold diuretics Monitor and replace electrolytes  GASTROINTESTINAL A:   No acute issues P:   PPI for GI prophylaxis  HEMATOLOGIC A:   No acute issues P:  Monitor CBC  INFECTIOUS A:   Low rade fever P:   F/u cultures Empiric antibiotics as above  ENDOCRINE A:   Hypothyroidism P:   Monitor glucose with BMP Synthroid at home dose  NEUROLOGIC A: New onset seizures/non-convulsive status epilepticus Acute encephalopathy 2/2 seizure activity Fall - unclear etiology-Syncope vs seizure; CT head negative and Seizure activity reported by EMS. R/O meningitis P:  Neurology following Neuro checks Keppra and dilantin per neurology. Now on propofol and midazolam infusions Follow LP results. Propofol for burst suppression and vent sedation  FAMILY  - Updates: Daughter in law updated at bedside. 2/2. - Inter-disciplinary family meet or Palliative Care meeting due by:  02/08.  Critical care time- 35 mins.   Chilton Greathouse MD [facility] Pager [phone] If no answer or after 3pm call: [phone] 11/20/2015, 1:47 PM

## 2015-11-20 NOTE — Procedures (Signed)
History: 82 yo M with status epilepticus  Sedation: Propofol then midazolam.   Technique: This is a continuous 21 channel routine scalp EEG are recorded from initiation at 9:43 AM 2/1 until 7:30 AM 2/2 recorded at the bedside with bipolar and monopolar montages arranged in accordance to the international 10/20 system of electrode placement. One channel was dedicated to EKG recording.   Background: Onset of recording there are periodic lateralized discharges in the right frontal region with fast activity associated, also associated with evolution and frequency and morphology. There are frequent periods of attenuation lasting 1-2 seconds. Following treatment with IV Dilantin and Ativan, initially the pattern changes to a PLEDs proper pattern. This is brief lived, however, and fast activity begins to be associated with the discharges. There are than brief runs lasting 10-15 seconds of increased frequency associated with rhythmic delta activity in the same distribution. There continues to be intermittent appearance of clear evolution superimposed on top of this pattern occurring intermittently. This is true until a burst suppression pattern appears at 4:20 PM following initiation of propofol drip.  Around 9 PM, there began to be intermittent runs lasting 5-10 seconds of right frontal discharges without clear evolution. The bursts become less frequent and not background becomes more continuous. The periodic discharges continue without clear evolution occurring at a frequency of 1 Hz. This pattern continues until around 1:30 AM, at which time there is fast activity associated with the discharges comprising a PLEDs plus pattern. This pattern was present throughout the remainder of the recording    Photic stimulation: Physiologic driving is not performed  EEG Abnormalities: 1) focal partial status epilepticus in the right frontal region with improvement following treatment 2) continued PLEDs-plus pattern  until the appearance of a burst suppression pattern at 4:20 PM 2/1 3) resumption of PLEDs plus pattern around 1:30 AM with continuation of this pattern throughout the remainder of the recording  Clinical Interpretation: This EEG recorded focal status epilepticus with improvement with treatment. The PLEDs plus pattern recorded in later the recording is an indeterminate pattern but can represent structural injury, postictal state, or continued ongoing seizure.  Ritta Slot, MD [facility] [phone]  If 7pm- 7am, please page neurology on call as listed in AMION.

## 2015-11-20 NOTE — Progress Notes (Addendum)
Met with son and daughter in law this am to introduce myself and explain Care Management role.   PTA, pt lived with son and daughter in law, and is assisted with ADLS by family.  Pt currently remains on ventilator, continuous EEG.  Will follow for discharge planning as pt progresses.    Reinaldo Raddle, RN, BSN  Trauma/Neuro ICU Case Manager [phone]

## 2015-11-20 NOTE — Progress Notes (Signed)
eLink Physician-Brief Progress Note Patient Name: William Dougherty DOB: [birth year] MRN: [id number]   Date of Service  11/20/2015  HPI/Events of Note  Low UOP, on dopamine and levo  eICU Interventions  250cc NS bolus     Intervention Category Major Interventions: Other:  Caraline Deutschman 11/20/2015, 12:08 AM

## 2015-11-20 NOTE — Progress Notes (Addendum)
2015-BP dropped 62/45 despite increasing dopamine to 10. [facility] notified, ordered to cut propofol in half and bolus 500cc NS. Orders carried out.   2115- BP responded to bolus now 120s/70s so propofol titrated back up to for burst suppression. BP maintaining MAP>65. Will continue to monitor for hypotension Boneta Standre, [place] Marie, RN

## 2015-11-20 NOTE — Progress Notes (Signed)
eLink Physician-Brief Progress Note Patient Name: William Dougherty DOB: November 23, 1925 MRN: [id number]   Date of Service  11/20/2015  HPI/Events of Note  Lots of fluids going with medications including saline  eICU Interventions  kvo saline     Intervention Category Intermediate Interventions: Medication change / dose adjustment  Max Fickle 11/20/2015, 3:51 PM

## 2015-11-20 NOTE — Progress Notes (Signed)
Narrative: Patient was per suppressed initially with propofol, however due to bradycardia and this was switched to midazolam in the night. Following this change, he began to have periodic discharges which later became associated with fast activity. Due to this,Midazolam was increased, however he continued to have discharges and therefore propofol was reintroduced this time without bradycardia making me think that it was likely Neo-Synephrine responsible for his bradycardia. He has had good response to this over the course of the day is been titrated up and is currently in a burst suppression pattern.   Exam: Filed Vitals:   11/20/15 1800 11/20/15 1901  BP: 117/66 117/66  Pulse: 70 69  Temp:    Resp: 14 14   Gen: In bed, intubated Resp: Ventilated  Abd: soft, nt  Neuro: Limited due to severe sedation MS: Does not open eyes, does not speak or follow commands CN: Pupils equal round reactive to light Motor: No movement Sensory: Does not respond  Pertinent Labs: Phenytoin level 23  Impression: 82 year old male with partial status epilepticus associated with reduced consciousness, though he was able to follow commands prior to more aggressive therapy.  I suspect this is secondary to his dementia, as seizures are not terribly uncommon with dementia and a percentage will present with status epilepticus.   He will need further evaluation with lumbar puncture to rule out inflammatory states or infection.  Recommendations: 1) lumbar puncture with cells, glucose, protein, IgG index 2) we'll add Vimpat given breakthrough discharges on lightening of sedation 3) continue burst suppression with propofol/midazolam through tomorrow, however even if he begins having partial seizures following this I would not favor continued prolonged-induced coma given that he was able to follow commands even while in status epilepticus 4) Will continue to follow.   This patient is critically ill and at  significant risk of neurological worsening, death and care requires constant monitoring of vital signs, hemodynamics,respiratory and cardiac monitoring, neurological assessment, discussion with family, other specialists and medical decision making of high complexity. I spent 60 minutes of neurocritical care time in the care of  this patient.  Ritta Slot, MD [facility] [phone]  If 7pm- 7am, please page neurology on call as listed in AMION. 11/20/2015  7:21 PM

## 2015-11-20 NOTE — Progress Notes (Signed)
Initial Nutrition Assessment  DOCUMENTATION CODES:   Severe malnutrition in context of chronic illness  INTERVENTION:   Initiate Vital High Protein @ 20 ml/hr via OG tube and increase by 10 ml every 12 hours to goal rate of 40 ml/hr.   Tube feeding regimen provides 960 kcal, 84 grams of protein, and 802 ml of H2O.  TF regimen and propofol at current rate providing 1374 total kcal/day (99 % of kcal needs)  Recommend monitor magnesium, potassium, and phosphorus daily for at least 3 days, MD to replete as needed, as pt is at risk for refeeding syndrome given severe malnutrition. (MD has ordered)  NUTRITION DIAGNOSIS:   Malnutrition related to chronic illness (dementia) as evidenced by severe depletion of body fat, severe depletion of muscle mass.  GOAL:   Patient will meet greater than or equal to 90% of their needs  MONITOR:   TF tolerance, Skin, Vent status, Labs  REASON FOR ASSESSMENT:   Consult Enteral/tube feeding initiation and management  ASSESSMENT:   82 YO male with dementia lives with son and daughter in law and is required total care presenting with new onset seizures/non-convulsive status epilepticus  Seizures continue, moved to ICU and intubated 2/1.   Patient is currently intubated on ventilator support MV: 6.6 L/min Temp (24hrs), Avg:96.9 F (36.1 C), Min:93.5 F (34.2 C), Max:99.7 F (37.6 C)  Propofol: 15.7 ml/hr provides: 414 kcal per day from lipid OG tube tip in proximal stomach Labs reviewed: magnesium low 1.5, potassium and phosphorus are WNL  Nutrition-Focused physical exam completed. Findings are severe fat depletion, severe muscle depletion, and mild edema.   No family present. Per chart review pt with very slow decline in weight. 4% weight loss in last 7 months per chart. No intake hx available. Pt meets criteria for malnutrition based on exam.   Diet Order:  Diet NPO time specified  Skin:  Wound (see comment) (dark spots to bilateral  buttocks)  Last BM:  unknown  Height:   Ht Readings from Last 1 Encounters:  11/19/15  (1.626 m)   Weight:   Wt Readings from Last 1 Encounters:  11/19/15 115 lb 1.3 oz (52.2 kg)   Ideal Body Weight:  59 kg  BMI:  Body mass index is 19.74 kg/(m^2).  Estimated Nutritional Needs:   Kcal:  1389  Protein:  75-85 grams  Fluid:  > 1.5 L/day  EDUCATION NEEDS:   No education needs identified at this time  Kendell Bane RD, LDN, CNSC [phone] Pager [phone] After Hours Pager

## 2015-11-20 NOTE — Progress Notes (Addendum)
eLink Pharmacist-Brief Progress Note Patient Name: William Dougherty DOB: 14-Jul-1926 MRN: [id number]   Date of Service  11/20/2015  HPI/Events of Note  Electrolytes evaluated. Mg 1.5  eICU Interventions  Mg IV 2g x 1 ordered   eLink Provider: Betha Loa, PharmD 11/20/2015, 4:00 PM

## 2015-11-21 ENCOUNTER — Inpatient Hospital Stay (HOSPITAL_COMMUNITY): Payer: Medicare Other

## 2015-11-21 LAB — GLUCOSE, CAPILLARY
GLUCOSE-CAPILLARY: 172 mg/dL — AB (ref 65–99)
GLUCOSE-CAPILLARY: 177 mg/dL — AB (ref 65–99)
Glucose-Capillary: 117 mg/dL — ABNORMAL HIGH (ref 65–99)
Glucose-Capillary: 122 mg/dL — ABNORMAL HIGH (ref 65–99)
Glucose-Capillary: 162 mg/dL — ABNORMAL HIGH (ref 65–99)
Glucose-Capillary: 164 mg/dL — ABNORMAL HIGH (ref 65–99)

## 2015-11-21 LAB — POCT I-STAT 3, ART BLOOD GAS (G3+)
Acid-base deficit: 5 mmol/L — ABNORMAL HIGH (ref 0.0–2.0)
Bicarbonate: 19.5 mEq/L — ABNORMAL LOW (ref 20.0–24.0)
O2 Saturation: 100 %
PCO2 ART: 32.1 mmHg — AB (ref 35.0–45.0)
TCO2: 20 mmol/L (ref 0–100)
pH, Arterial: 7.389 (ref 7.350–7.450)
pO2, Arterial: 196 mmHg — ABNORMAL HIGH (ref 80.0–100.0)

## 2015-11-21 LAB — CBC
HEMATOCRIT: 34.7 % — AB (ref 39.0–52.0)
HEMOGLOBIN: 12 g/dL — AB (ref 13.0–17.0)
MCH: 31.3 pg (ref 26.0–34.0)
MCHC: 34.6 g/dL (ref 30.0–36.0)
MCV: 90.4 fL (ref 78.0–100.0)
PLATELETS: 169 10*3/uL (ref 150–400)
RBC: 3.84 MIL/uL — AB (ref 4.22–5.81)
RDW: 13.6 % (ref 11.5–15.5)
WBC: 7.5 10*3/uL (ref 4.0–10.5)

## 2015-11-21 LAB — BASIC METABOLIC PANEL
ANION GAP: 10 (ref 5–15)
BUN: 20 mg/dL (ref 6–20)
CHLORIDE: 110 mmol/L (ref 101–111)
CO2: 21 mmol/L — AB (ref 22–32)
Calcium: 8.4 mg/dL — ABNORMAL LOW (ref 8.9–10.3)
Creatinine, Ser: 1.32 mg/dL — ABNORMAL HIGH (ref 0.61–1.24)
GFR calc Af Amer: 54 mL/min — ABNORMAL LOW (ref >60)
GFR calc non Af Amer: 47 mL/min — ABNORMAL LOW (ref >60)
GLUCOSE: 185 mg/dL — AB (ref 65–99)
POTASSIUM: 3.3 mmol/L — AB (ref 3.5–5.1)
Sodium: 141 mmol/L (ref 135–145)

## 2015-11-21 LAB — HERPES SIMPLEX VIRUS(HSV) DNA BY PCR
HSV 1 DNA: NEGATIVE
HSV 2 DNA: NEGATIVE

## 2015-11-21 LAB — PHOSPHORUS: Phosphorus: 2.9 mg/dL (ref 2.5–4.6)

## 2015-11-21 LAB — MAGNESIUM: Magnesium: 2 mg/dL (ref 1.7–2.4)

## 2015-11-21 LAB — CSF IGG: IGG CSF: 4.7 mg/dL (ref 0.0–8.6)

## 2015-11-21 LAB — PROCALCITONIN: Procalcitonin: 0.58 ng/mL

## 2015-11-21 LAB — PHENYTOIN LEVEL, TOTAL: Phenytoin Lvl: 20 ug/mL (ref 10.0–20.0)

## 2015-11-21 MED ORDER — HEPARIN SODIUM (PORCINE) 5000 UNIT/ML IJ SOLN
5000.0000 [IU] | Freq: Three times a day (TID) | INTRAMUSCULAR | Status: DC
  Administered 2015-11-21 – 2015-11-23 (×8): 5000 [IU] via SUBCUTANEOUS
  Filled 2015-11-21 (×8): qty 1

## 2015-11-21 MED ORDER — POTASSIUM CHLORIDE 10 MEQ/100ML IV SOLN
10.0000 meq | INTRAVENOUS | Status: AC
  Administered 2015-11-21 (×4): 10 meq via INTRAVENOUS
  Filled 2015-11-21 (×4): qty 100

## 2015-11-21 NOTE — Progress Notes (Signed)
Dr Amada Jupiter states he's been aware of the unequal pupils ranging from sluggish to nonreactive at times. Will continue to monitor.

## 2015-11-21 NOTE — Progress Notes (Signed)
Magadalene NP made aware of pt with urine output of approx 15-38ml/hr. NP stated to wait to see if U/O picked up after additional volume from K+ replacement would change U/O. Will continue to monitor.

## 2015-11-21 NOTE — Progress Notes (Signed)
Pharmacy Antibiotic Note  William Dougherty is a 82 y.o. male admitted on 11/08/2015 with sepsis.  Pharmacy has been consulted for vancomycin and zosyn dosing. Today is D#4 for therapy. Pt is afebrile but some hypothermia and WBC is WNL. Scr is slightly elevated but stable at 1.32. Doses remain appropriate  Plan: - Continue vancomycin  IV Q24H - Continue zosyn 3.375gm IV Q8H (4 hr inf) - F/u renal fxn, C&S, clinical status and trough at [facility] - MD - Consider de-escalating antibiotics  Height:  (162.6 cm) Weight: 121 lb 4.1 oz (55 kg) IBW/kg (Calculated) : 59.2  Temp (24hrs), Avg:96.9 F (36.1 C), Min:93.2 F (34 C), Max:98.8 F (37.1 C)   Recent Labs Lab 11/06/2015 1900 11/15/2015 1906 11/12/2015 2123 11/19/15 0037 11/20/15 0400 11/21/15 0622  WBC  --  8.8  --   --  9.2 7.5  CREATININE 1.20 1.29*  --   --  1.28* 1.32*  LATICACIDVEN 2.98*  --  0.96 1.31  --   --     Estimated Creatinine Clearance: 30.7 mL/min (by C-G formula based on Cr of 1.32).    Allergies  Allergen Reactions  . Eggs Or Egg-Derived Products Diarrhea and Nausea Only    Antimicrobials this admission: Vanc 1/31 >>  Zosyn 1/31 >>   Dose adjustments this admission: N/A  Microbiology results: 1/31 Urine - NEG 1/31 Blood - NGTD 2/1 MRSA - NEG 1/31 Flu - NEG  Lysle Pearl, PharmD, BCPS Pager # [phone] 11/21/2015 8:05 AM

## 2015-11-21 NOTE — Progress Notes (Signed)
2330~ Patient became hypotensive again 68/47. Notified Dr Roseanne Reno, ordered to decrease propofol rate to and to notify him if hypotension persists. Family aware. Order carried out, BP now sustaining 120s/50s with dopamine at . Will continue to monitor.

## 2015-11-21 NOTE — Procedures (Signed)
History: 81 year old male presenting with partial status epilepticus  Sedation: Propofol, Midazolam  Technique: This is a 21 channel continuous EEG recording from 7:30 AM 2/2 until 7:30 AM 2/3 scalp EEG performed at the bedside with bipolar and monopolar montages arranged in accordance to the international 10/20 system of electrode placement. One channel was dedicated to EKG recording.    Background:  From onset of the recording, there is a periodic pattern in the right frontal region consisting of a polyspike and wave and occasional periods of generalized attenuation. No posterior dominant rhythm visualized, but there are occasional sleep spindles in the left hemisphere. The course the morning, the discharges became less prominent comment. Attenuation became longer, however these discharges did continue interrupted by 3-4 second periods of attenuation around 1 PM. There is attenuation beginning progressively longer, and a true burst suppression pattern emerged around 3 PM. However, there still were occasional breakthrough discharges both during the bursts and between them. By 5 PM, there is only epileptiform activity within the burst. This improves with the bursts becoming progressively more symmetric and less epileptiform-appearing over the course of the recording.  Currently the bursts occurring with on and a burst interval of 10-15 seconds, lasting 2-3 seconds and consisting of delta with some superimposed beta activity. There is slight attenuation on the right compared to left.  Photic stimulation: Physiologic driving is not performed  EEG Abnormalities: 1) PLEDs plus pattern from onset of recording until 3 PM 2) burst suppression pattern with improvement in epileptiform appearance over the remainder of the recording  Clinical Interpretation: PLEDs are an indeterminate pattern which can represent structural injury, postictal phenomenon, represent ongoing focal seizure activity. The associated  fast activity does raise my suspicion for the fact that this represented partial ongoing status, however this is not definite given there are no periods of evolution as were seen earlier in the recording yesterday. There is marked improvement throughout the study with only extremely rare epileptiform discharges in the early hours of 2/3.  Ritta Slot, MD [facility] [phone]  If 7pm- 7am, please page neurology on call as listed in AMION.

## 2015-11-21 NOTE — Progress Notes (Signed)
Rectal temp 93.2 @ 0400. Bair hugger reapplied.

## 2015-11-21 NOTE — Progress Notes (Signed)
Narrative: Over the course of the day I have been lightening his sedation. He has had resumption of a PLEDs pattern on EEG, but on spot checks at least I have not seen any clear focal seizures.    Exam: Filed Vitals:   11/21/15 1600 11/21/15 1615  BP: 92/52 99/54  Pulse: 75 70  Temp:    Resp: 14 14   Gen: In bed, intubated Resp: Ventilated  Abd: soft, nt  Neuro: Limited due to severe sedation MS: Does not open eyes, does not speak or follow commands CN: Pupils equal round reactive to light Motor: No movement Sensory: Does not respond  Pertinent Labs: Phenytoin level 20  Lumbar puncture 0 WBC, mildly elevated protein  Impression: 82 year old male with partial status epilepticus associated with reduced consciousness, though he was able to follow commands prior to more aggressive therapy.  I suspect this is secondary to his dementia, as seizures are not terribly uncommon with dementia and a percentage will present with status epilepticus.     Recommendations: 1) continue Vimpat 200 twice a day, Keppra 1500 twice a day, Dilantin 75 3 times a day  2) continue weaning sedation 3) Will continue to follow.   This patient is critically ill and at significant risk of neurological worsening, death and care requires constant monitoring of vital signs, hemodynamics,respiratory and cardiac monitoring, neurological assessment, discussion with family, other specialists and medical decision making of high complexity. I spent 50 minutes of neurocritical care time in the care of  this patient.  Ritta Slot, MD [facility] [phone]  If 7pm- 7am, please page neurology on call as listed in AMION. 11/21/2015  5:44 PM

## 2015-11-21 NOTE — Progress Notes (Signed)
Name: [name] MRN: [id number] DOB: Sep 07, 1926    ADMISSION DATE:  12/15/2015   CONSULTATION DATE:  2015/12/15  REFERRING MD :  EDP  CHIEF COMPLAINT:  AMS, fall  HISTORY OF PRESENT ILLNESS:  William Dougherty is a 82 y.o. male with a PMH as outlined below.  He was in his USOH on evening of December 15, 2015 and after eating dinner, he was sitting at dinner table while family was in other room.  Family then heard a loud noise and when they came to dinner table, pt was lying on the floor.  EMS was dispatched and on en route to ED, pt had seizure like activity for 1 minute for which he was given  versed.  In ED, pt was hypothermic to 96.42F.  Initial lactate was 2.98 with repeat down to 0.96.  He had mild AKI (SCr 1.29).  ABG revealed hypoxemia; otherwise normal.   Head CT was negative for acute process.  Pt was minimally responsive however; therefore, he was placed on BiPAP.  Per notes, he remained minimally responsive; therefore, PCCM was called due to concern he may fail BiPAP.  During my exam, pt is pulling at mask and attempting to get it off.  Repeat lactate is down to 0.96.  Remaining labs essentially normal with exception of mild AKI.  Per family, pt has fairly significant dementia.  He is able to walk at home using a walker, but otherwise needs assistance with ADL's.  He is able to eat, but eats pureed food and requires assistance with feeding. Despite his dementia, family is certain that if needed, they would opt for short term life support / full code status.  On 02/01 patient became obtunded after receiving keppra and versed. 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 Seen by neurology and there was concern for new onset seizures/non-convulsive status epilseptics and patient's ability to maintain airway while receiving sedating medications for seizure control. PCCM was consulted for intubation and airway management.  SUBJECTIVE:   Seizures improving. Titrating off  propofol. Still requiring pressors.    VITAL SIGNS: Temp:  [93.2 F (34 C)-98.8 F (37.1 C)] 98.8 F (37.1 C) (02/03 0736) Pulse Rate:  [56-86] 71 (02/03 0830) Resp:  [13-16] 14 (02/03 0830) BP: (61-143)/(41-76) 83/43 mmHg (02/03 0830) SpO2:  [97 %-100 %] 99 % (02/03 0830) FiO2 (%):  [40 %] 40 % (02/03 0800) Weight:  [121 lb 0.5 oz (54.9 kg)-121 lb 4.1 oz (55 kg)] 121 lb 4.1 oz (55 kg) (02/03 0443)  PHYSICAL EXAMINATION: General: Deep sedation, unresponsive.  Neuro: Pupils dilated, unreactive, no corneal reflex HEENT: sclerae anicteric, moist mucus membranes Cardiovascular: RRR, no MRG, no edema  Lungs: No wheezing, crackles.  Abdomen: Non-distended; soft, +BS Musculoskeletal: No gross deformities Skin: No rashes.    Recent Labs Lab 12/15/2015 1906 11/20/15 0400 11/21/15 0622  NA 141 137 141  K 4.5 3.7 3.3*  CL 104 106 110  CO2 24 23 21*  BUN 28* 20 20  CREATININE 1.29* 1.28* 1.32*  GLUCOSE 160* 151* 185*    Recent Labs Lab 12-15-15 1906 11/20/15 0400 11/21/15 0622  HGB 11.8* 12.9* 12.0*  HCT 36.6* 38.4* 34.7*  WBC 8.8 9.2 7.5  PLT 224 208 169   Dg Chest Port 1 View  11/21/2015  CLINICAL DATA:  CABG. EXAM: PORTABLE CHEST 1 VIEW COMPARISON:  11/20/2015 . FINDINGS: Endotracheal tube in stable position. Interim placement NG tube, its tip is below left hemidiaphragm. Mild left lower lobe and possible right upper lobe infiltrate.  No pleural effusion or pneumothorax. No acute bony abnormality. IMPRESSION: 1. Endotracheal tube in stable position . Interim placement of NG tube, its tip is below the left hemidiaphragm. 2. Low lung volumes with mild left lower lobe and possibly right upper lobe infiltrate. Electronically Signed   By: Maisie Fus  Register   On: 11/21/2015 07:21   Portable Chest Xray  11/20/2015  CLINICAL DATA:  82 year old male with respiratory failure. Recent fall and seizure activity. Initial encounter. EXAM: PORTABLE CHEST 1 VIEW COMPARISON:  11/19/2015 and  earlier. FINDINGS: Portable AP semi upright view at 0606 hours. The patient is more rotated to the left. Endotracheal tube tip is stable between the a clavicles and carina. Lung parenchyma appears clear. No pneumothorax or pleural effusion. Stable cardiac size and mediastinal contours. Calcified aortic atherosclerosis. IMPRESSION: Stable endotracheal tube.  No acute cardiopulmonary abnormality. Electronically Signed   By: Odessa Fleming M.D.   On: 11/20/2015 07:35   Dg Chest Port 1 View  11/19/2015  CLINICAL DATA:  Intubation. EXAM: PORTABLE CHEST 1 VIEW COMPARISON:  December 06, 2015 FINDINGS: Endotracheal tube tip is 5.1 cm above the base of the carina. Lungs are hyperexpanded. Opacity in the left apex likely related to a confluence of shadows given the appearance of this region on the x-ray yesterday. The cardiopericardial silhouette is within normal limits for size. Telemetry leads overlie the chest. IMPRESSION: Endotracheal tube tip is 5 cm above the base of the carina. Electronically Signed   By: Kennith Center M.D.   On: 11/19/2015 16:47   Dg Abd Portable 1v  11/20/2015  CLINICAL DATA:  Status epilepticus.  Orogastric tube placement. EXAM: PORTABLE ABDOMEN - 1 VIEW COMPARISON:  05/08/2015 FINDINGS: A new orogastric tube is seen with tip in the fundus of the stomach. The bowel gas pattern is normal. IMPRESSION: Orogastric tube tip in proximal stomach. Electronically Signed   By: Myles Rosenthal M.D.   On: 11/20/2015 12:25    STUDIES:  CT head and c-spine 01/31 > no acute process.  Antibiotics Vancomycin 02/01>> Zosyn 02/01>>  Cultures 01/31 Blood>>NTD Urine>>NTD CSF 02/02>NTD  SIGNIFICANT EVENTS  01/31-Found "humped over" at home>>ED> admitted with acute encephalopathy and new onset seizures. 02/01-Obtunded>>intubated  DISCUSSION: 82 YO male with dementia presenting with new onset seizures/non-convulsive status epilepticus  ASSESSMENT / PLAN:  PULMONARY A: Acute hypoxemic respiratory failure  secondary to non-convulsive status epilepticus. Possible aspiration - no hx to support; however, pt has had dysphagia for years and is unresponsvie. New right upper lobe lung opacity-no leukocytosis P: Full vent support VAP prevention Albuterol prn Empiric abx for aspiration PNA Daily CXR    CARDIOVASCULAR A:  Hypotension H/O Hypertension P:  Hemodynamic monitoring per ICU protocol Neo-synephrine  and dopamine gttes; titrate to MAP goal >65 Hold all home antihypertensives IV hydration  RENAL A:   AKI-Creatinine 1.3 Hypokalemia P:   Monitor I/O F/U BMP Hold diuretics KCL IV X1 Monitor and replace electrolytes  GASTROINTESTINAL A:   No acute issues P:   NPO PPI for GI prophylaxis Tube feeds per nutritional services  HEMATOLOGIC A:   No acute issues P:  Monitor CBC  INFECTIOUS A:   Low rade fever New right upper lobe lung opacity on CXR P:   Cultures negative to date Continue empiric antibiotics  Will check procalcitonin level, if normal will d/c antibiotics  ENDOCRINE A:   Hypothyroidism P:   Monitor glucose with BMP Synthroid at home dose  NEUROLOGIC A: New onset seizures/non-convulsive status epilepticus Acute encephalopathy 2/2 seizure  activity Fall - unclear etiology-Syncope vs seizure; CT head negative and Seizure activity reported by EMS. R/O meningitis P:  Neurology following Neuro checks Keppra, vimpat, and dilantin per neurology. Titrate off propofol and midazolam infusions Follow LP results; cultures ntd   FAMILY  - Updates: Daughter in law updated at bedside 02/03 - Inter-disciplinary family meet or Palliative Care meeting due by:  02/08.  Best Practice: Code Status:  Full. Diet: NPO, tube feeds GI prophylaxis:  PPI. VTE prophylaxis:  SCD's / heparin.  [facility] ANP-BC Pulmonary and Critical Care Medicine [facility] Pager: [phone] 11/21/2015, 8:58 AM

## 2015-11-21 NOTE — Progress Notes (Signed)
Dr Molli Knock made aware of urine output of less then or equal to 60ml/hr. No new orders received, will continue to monitor.

## 2015-11-22 DIAGNOSIS — R404 Transient alteration of awareness: Secondary | ICD-10-CM

## 2015-11-22 DIAGNOSIS — G40901 Epilepsy, unspecified, not intractable, with status epilepticus: Secondary | ICD-10-CM

## 2015-11-22 LAB — BASIC METABOLIC PANEL
Anion gap: 14 (ref 5–15)
BUN: 29 mg/dL — AB (ref 6–20)
CHLORIDE: 110 mmol/L (ref 101–111)
CO2: 19 mmol/L — ABNORMAL LOW (ref 22–32)
Calcium: 8.5 mg/dL — ABNORMAL LOW (ref 8.9–10.3)
Creatinine, Ser: 1.54 mg/dL — ABNORMAL HIGH (ref 0.61–1.24)
GFR calc Af Amer: 45 mL/min — ABNORMAL LOW (ref >60)
GFR calc non Af Amer: 39 mL/min — ABNORMAL LOW (ref >60)
Glucose, Bld: 158 mg/dL — ABNORMAL HIGH (ref 65–99)
POTASSIUM: 4.6 mmol/L (ref 3.5–5.1)
SODIUM: 143 mmol/L (ref 135–145)

## 2015-11-22 LAB — CBC
HEMATOCRIT: 37.3 % — AB (ref 39.0–52.0)
HEMOGLOBIN: 12.7 g/dL — AB (ref 13.0–17.0)
MCH: 31.4 pg (ref 26.0–34.0)
MCHC: 34 g/dL (ref 30.0–36.0)
MCV: 92.1 fL (ref 78.0–100.0)
Platelets: 173 10*3/uL (ref 150–400)
RBC: 4.05 MIL/uL — ABNORMAL LOW (ref 4.22–5.81)
RDW: 14.5 % (ref 11.5–15.5)
WBC: 12.6 10*3/uL — ABNORMAL HIGH (ref 4.0–10.5)

## 2015-11-22 LAB — MAGNESIUM: MAGNESIUM: 1.8 mg/dL (ref 1.7–2.4)

## 2015-11-22 LAB — GLUCOSE, CAPILLARY
GLUCOSE-CAPILLARY: 196 mg/dL — AB (ref 65–99)
GLUCOSE-CAPILLARY: 156 mg/dL — AB (ref 65–99)
GLUCOSE-CAPILLARY: 147 mg/dL — AB (ref 65–99)
GLUCOSE-CAPILLARY: 165 mg/dL — AB (ref 65–99)
GLUCOSE-CAPILLARY: 174 mg/dL — AB (ref 65–99)
Glucose-Capillary: 137 mg/dL — ABNORMAL HIGH (ref 65–99)
Glucose-Capillary: 158 mg/dL — ABNORMAL HIGH (ref 65–99)

## 2015-11-22 LAB — PROCALCITONIN: PROCALCITONIN: 0.4 ng/mL

## 2015-11-22 LAB — PHENYTOIN LEVEL, TOTAL: PHENYTOIN LVL: 18.8 ug/mL (ref 10.0–20.0)

## 2015-11-22 LAB — PHOSPHORUS: Phosphorus: 3.7 mg/dL (ref 2.5–4.6)

## 2015-11-22 MED ORDER — SODIUM CHLORIDE 0.9 % IV SOLN
1000.0000 mg | Freq: Two times a day (BID) | INTRAVENOUS | Status: DC
  Administered 2015-11-22 – 2015-11-23 (×2): 1000 mg via INTRAVENOUS
  Filled 2015-11-22 (×3): qty 10

## 2015-11-22 MED ORDER — PANTOPRAZOLE SODIUM 40 MG PO PACK
40.0000 mg | PACK | Freq: Every day | ORAL | Status: DC
  Administered 2015-11-22 – 2015-11-23 (×2): 40 mg
  Filled 2015-11-22 (×2): qty 20

## 2015-11-22 MED ORDER — PIPERACILLIN-TAZOBACTAM 3.375 G IVPB
3.3750 g | Freq: Three times a day (TID) | INTRAVENOUS | Status: DC
  Administered 2015-11-22 – 2015-11-26 (×13): 3.375 g via INTRAVENOUS
  Filled 2015-11-22 (×15): qty 50

## 2015-11-22 NOTE — Progress Notes (Signed)
Dr. Amada Jupiter asked for versed to be stopped. 20 cc wasted of  versed to 1ml of NS wasted in sink. This was witness by Lily Peer RN.

## 2015-11-22 NOTE — Progress Notes (Signed)
Narrative: EEG continues to improve. He has been off sedation since yesterday evening.  Exam: Filed Vitals:   11/22/15 1045 11/22/15 1124  BP: 118/61   Pulse: 73   Temp:  100.3 F (37.9 C)  Resp: 15    Gen: In bed, intubated Resp: Ventilated  Abd: soft, nt  Neuro:  MS: Does not open eyes, does not speak or follow commands CN: Left pupil postsurgical, right pupil reactive Motor: No movement Sensory: Does not respond  Pertinent Labs: Phenytoin level 18.8  Lumbar puncture 0 WBC, mildly elevated protein  Impression: 82 year old male with partial status epilepticus associated with reduced consciousness, though he was able to follow commands prior to more aggressive therapy.  I suspect this is secondary to his dementia, as seizures are not terribly uncommon with dementia and a percentage will present with status epilepticus.   CSF was performed with 0 white cells and mildly elevated protein at 50, normal IgG index. With no signs of inflammation on CSF either with pleocytosis or elevation of the IgG index, I do not think that an inflammatory cause is very likely.  Recommendations: 1) continue Vimpat 200 twice a day, Dilantin 75 3 times a day  2) recheck Dilantin level with albumin tomorrow 3) decrease Keppra to 1000 twice a day given renal impairment  This patient is critically ill and at significant risk of neurological worsening, death and care requires constant monitoring of vital signs, hemodynamics,respiratory and cardiac monitoring, neurological assessment, discussion with family, other specialists and medical decision making of high complexity. I spent 60 minutes of neurocritical care time in the care of  this patient.  Ritta Slot, MD [facility] [phone]  If 7pm- 7am, please page neurology on call as listed in AMION. 11/22/2015  11:34 AM

## 2015-11-22 NOTE — Progress Notes (Signed)
Name: William Dougherty MRN: [id number] DOB: 02-21-1926    ADMISSION DATE:  December 07, 2015   CONSULTATION DATE:  12/07/2015  REFERRING MD :  EDP  CHIEF COMPLAINT:  AMS, fall  HISTORY OF PRESENT ILLNESS:  William Dougherty is a 82 y.o. male with a PMH as outlined below.  He was in his USOH on evening of 07-Dec-2015 and after eating dinner, he was sitting at dinner table while family was in other room.  Family then heard a loud noise and when they came to dinner table, pt was lying on the floor.  EMS was dispatched and on en route to ED, pt had seizure like activity for 1 minute for which he was given  versed.  In ED, pt was hypothermic to 96.57F.  Initial lactate was 2.98 with repeat down to 0.96.  He had mild AKI (SCr 1.29).  ABG revealed hypoxemia; otherwise normal.   Head CT was negative for acute process.  Pt was minimally responsive however; therefore, he was placed on BiPAP.  Per notes, he remained minimally responsive; therefore, PCCM was called due to concern he may fail BiPAP.  During my exam, pt is pulling at mask and attempting to get it off.  Repeat lactate is down to 0.96.  Remaining labs essentially normal with exception of mild AKI.  Per family, pt has fairly significant dementia.  He is able to walk at home using a walker, but otherwise needs assistance with ADL's.  He is able to eat, but eats pureed food and requires assistance with feeding. Despite his dementia, family is certain that if needed, they would opt for short term life support / full code status.  On 02/01 patient became obtunded after receiving keppra and versed. 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 Seen by neurology and there was concern for new onset seizures/non-convulsive status epilseptics and patient's ability to maintain airway while receiving sedating medications for seizure control. PCCM was consulted for intubation and airway management.  SUBJECTIVE:   Remains on dopamine and off sedation with  no response.   VITAL SIGNS: Temp:  [97.8 F (36.6 C)-100.6 F (38.1 C)] 100.6 F (38.1 C) (02/04 0745) Pulse Rate:  [66-81] 76 (02/04 0745) Resp:  [14-17] 14 (02/04 0745) BP: (67-163)/(43-95) 154/63 mmHg (02/04 0745) SpO2:  [98 %-100 %] 100 % (02/04 0745) FiO2 (%):  [40 %] 40 % (02/04 0720) Weight:  [57.2 kg (126 lb 1.7 oz)] 57.2 kg (126 lb 1.7 oz) (02/04 0400)  PHYSICAL EXAMINATION: General: Off sedation, not responsive, does not withdraw to pain. Neuro: Pupils dilated, left irregular, sluggish, no corneal reflex. HEENT: Sclerae anicteric, moist mucus membranes. Cardiovascular: RRR, no MRG, no edema  Lungs: No wheezing, crackles.  Abdomen: Non-distended; soft, +BS Musculoskeletal: No gross deformities Skin: No rashes.  Recent Labs Lab 07-Dec-2015 1906 11/20/15 0400 11/21/15 0622  NA 141 137 141  K 4.5 3.7 3.3*  CL 104 106 110  CO2 24 23 21*  BUN 28* 20 20  CREATININE 1.29* 1.28* 1.32*  GLUCOSE 160* 151* 185*   Recent Labs Lab Dec 07, 2015 1906 11/20/15 0400 11/21/15 0622  HGB 11.8* 12.9* 12.0*  HCT 36.6* 38.4* 34.7*  WBC 8.8 9.2 7.5  PLT 224 208 169   Dg Chest Port 1 View  11/21/2015  CLINICAL DATA:  CABG. EXAM: PORTABLE CHEST 1 VIEW COMPARISON:  11/20/2015 . FINDINGS: Endotracheal tube in stable position. Interim placement NG tube, its tip is below left hemidiaphragm. Mild left lower lobe and possible right upper lobe infiltrate. No  pleural effusion or pneumothorax. No acute bony abnormality. IMPRESSION: 1. Endotracheal tube in stable position . Interim placement of NG tube, its tip is below the left hemidiaphragm. 2. Low lung volumes with mild left lower lobe and possibly right upper lobe infiltrate. Electronically Signed   By: William Fus  Dougherty   On: 11/21/2015 07:21   Dg Abd Portable 1v  11/20/2015  CLINICAL DATA:  Status epilepticus.  Orogastric tube placement. EXAM: PORTABLE ABDOMEN - 1 VIEW COMPARISON:  05/08/2015 FINDINGS: A new orogastric tube is seen with tip in  the fundus of the stomach. The bowel gas pattern is normal. IMPRESSION: Orogastric tube tip in proximal stomach. Electronically Signed   By: William Dougherty M.D.   On: 11/20/2015 12:25   STUDIES:  CT head and c-spine 01/31 > no acute process.  Antibiotics Vancomycin 02/01>> Zosyn 02/01>>  Cultures 01/31 Blood>>NTD Urine>>NTD CSF 02/02>NTD  SIGNIFICANT EVENTS  01/31-Found "humped over" at home>>ED> admitted with acute encephalopathy and new onset seizures. 02/01-Obtunded>>intubated  DISCUSSION: 82 YO male with dementia presenting with new onset seizures/non-convulsive status epilepticus  ASSESSMENT / PLAN:  PULMONARY A: Acute hypoxemic respiratory failure secondary to non-convulsive status epilepticus. Possible aspiration - no hx to support; however, pt has had dysphagia for years and is unresponsvie. New right upper lobe lung opacity-no leukocytosis P: Begin PS trials. VAP prevention. Albuterol prn. Empiric abx for aspiration PNA. Daily CXR.    CARDIOVASCULAR A:  Hypotension H/O Hypertension P:  Hemodynamic monitoring per ICU protocol Dopamine gttes; titrate to MAP goal >65 at 16 mcg. Hold all home antihypertensives KVO IVF.  RENAL A:   AKI-Creatinine 1.3 Hypokalemia P:   Monitor I/O F/U BMP Hold diuretics Monitor and replace electrolytes  GASTROINTESTINAL A:   No acute issues P:   PPI for GI prophylaxis. Tube feeds per nutrition.  HEMATOLOGIC A:   No acute issues P:  Monitor CBC  INFECTIOUS A:   Low rade fever New right upper lobe lung opacity on CXR P:   Cultures negative to date Continue empiric antibiotics  Will check procalcitonin level, if normal will d/c antibiotics  ENDOCRINE A:   Hypothyroidism P:   Monitor glucose with BMP Synthroid at home dose  NEUROLOGIC A: New onset seizures/non-convulsive status epilepticus Acute encephalopathy 2/2 seizure activity Fall - unclear etiology-Syncope vs seizure; CT head negative  and Seizure activity reported by EMS. R/O meningitis P:  Neurology following Neuro checks Keppra, vimpat, and dilantin per neurology. Titrate off propofol and midazolam infusions Follow LP results; cultures ntd   FAMILY  - Updates: No family bedside. - Inter-disciplinary family meet or Palliative Care meeting due by:  02/08.  The patient is critically ill with multiple organ systems failure and requires high complexity decision making for assessment and support, frequent evaluation and titration of therapies, application of advanced monitoring technologies and extensive interpretation of multiple databases.   Critical Care Time devoted to patient care services described in this note is  35  Minutes. This time reflects time of care of this signee Dr Koren Bound. This critical care time does not reflect procedure time, or teaching time or supervisory time of PA/NP/Med student/Med Resident etc but could involve care discussion time.  Alyson Reedy, M.D. [facility] Pulmonary/Critical Care Medicine. Pager: [phone]. After hours pager: [phone].  11/22/2015, 8:08 AM

## 2015-11-22 NOTE — Progress Notes (Signed)
Dr Molli Knock made aware of when turning the patient a large area of frank red blood was noted to be under the patients bottom. No lesions or ulcers noted to rectum or active bleeding. Orders given to insert temp foley to avoid checking patients rectal temp. Will continue to monitor.

## 2015-11-22 NOTE — Procedures (Signed)
History: 82 year old male with partial status epilepticus  Sedation: None  Technique: This is a 21 channel routine scalp EEG performed at the bedside with bipolar and monopolar montages arranged in accordance to the international 10/20 system of electrode placement. One channel was dedicated to EKG recording.   Background: Onset of recording there is a burst suppression pattern of burst consisting of irregular theta and delta with and and a burst interval of approximately 10 seconds. There is no clear epileptiform activity. Following lightening of sedation, there is occasional intrusion into the bursts of right frontal sharp waves. As sedation continues to lightening, these occur periodically, with a frequency around 1-1.5 Hz. There is no clear evolution associated with these discharges, and there are sleep structures recorded during this period which appear symmetrically.  Course the recording, background becomes more continuous, still with appearance of sleep structures and the periodic discharges actually occur less frequently in the latter few hours of the recording   Photic stimulation: Physiologic driving is not performed  EEG Abnormalities: 1) periodic lateralized epileptiform discharges in the right frontal region 2) initial burst suppression pattern, followed by increasing continuity of the background  Clinical Interpretation: Periodic lateralized epileptiform discharges (PLEDs) are an indeterminate pattern which can represent focal structural injury, be a postictal finding, or rarely represent ongoing focal seizure.   There was improvement in the background over the course the recording with symmetric sleep structures present.   Ritta Slot, MD [facility] [phone]  If 7pm- 7am, please page neurology on call as listed in AMION.

## 2015-11-23 ENCOUNTER — Inpatient Hospital Stay (HOSPITAL_COMMUNITY): Payer: Medicare Other

## 2015-11-23 DIAGNOSIS — R569 Unspecified convulsions: Secondary | ICD-10-CM

## 2015-11-23 LAB — CBC
HCT: 32.1 % — ABNORMAL LOW (ref 39.0–52.0)
Hemoglobin: 11.2 g/dL — ABNORMAL LOW (ref 13.0–17.0)
MCH: 32.1 pg (ref 26.0–34.0)
MCHC: 34.9 g/dL (ref 30.0–36.0)
MCV: 92 fL (ref 78.0–100.0)
PLATELETS: 175 10*3/uL (ref 150–400)
RBC: 3.49 MIL/uL — AB (ref 4.22–5.81)
RDW: 14.6 % (ref 11.5–15.5)
WBC: 12.7 10*3/uL — ABNORMAL HIGH (ref 4.0–10.5)

## 2015-11-23 LAB — PROCALCITONIN: PROCALCITONIN: 0.32 ng/mL

## 2015-11-23 LAB — BLOOD GAS, ARTERIAL
Acid-base deficit: 3.1 mmol/L — ABNORMAL HIGH (ref 0.0–2.0)
Bicarbonate: 20.7 mEq/L (ref 20.0–24.0)
Drawn by: 44166
FIO2: 0.3
MECHVT: 480 mL
O2 Saturation: 98.6 %
PATIENT TEMPERATURE: 98.2
PCO2 ART: 32.6 mmHg — AB (ref 35.0–45.0)
PEEP: 5 cmH2O
PO2 ART: 118 mmHg — AB (ref 80.0–100.0)
RATE: 16 resp/min
TCO2: 21.7 mmol/L (ref 0–100)
pH, Arterial: 7.417 (ref 7.350–7.450)

## 2015-11-23 LAB — CBC WITH DIFFERENTIAL/PLATELET
BASOS PCT: 0 %
Basophils Absolute: 0 10*3/uL (ref 0.0–0.1)
EOS ABS: 0 10*3/uL (ref 0.0–0.7)
Eosinophils Relative: 0 %
HEMATOCRIT: 30.2 % — AB (ref 39.0–52.0)
HEMOGLOBIN: 10.3 g/dL — AB (ref 13.0–17.0)
Lymphocytes Relative: 7 %
Lymphs Abs: 0.9 10*3/uL (ref 0.7–4.0)
MCH: 31.5 pg (ref 26.0–34.0)
MCHC: 34.1 g/dL (ref 30.0–36.0)
MCV: 92.4 fL (ref 78.0–100.0)
Monocytes Absolute: 0.9 10*3/uL (ref 0.1–1.0)
Monocytes Relative: 7 %
NEUTROS ABS: 11.2 10*3/uL — AB (ref 1.7–7.7)
NEUTROS PCT: 86 %
Platelets: 191 10*3/uL (ref 150–400)
RBC: 3.27 MIL/uL — AB (ref 4.22–5.81)
RDW: 14.8 % (ref 11.5–15.5)
WBC: 13 10*3/uL — AB (ref 4.0–10.5)

## 2015-11-23 LAB — TYPE AND SCREEN
ABO/RH(D): O POS
Antibody Screen: NEGATIVE

## 2015-11-23 LAB — BASIC METABOLIC PANEL
Anion gap: 9 (ref 5–15)
BUN: 37 mg/dL — AB (ref 6–20)
CO2: 21 mmol/L — ABNORMAL LOW (ref 22–32)
CREATININE: 1.23 mg/dL (ref 0.61–1.24)
Calcium: 7.9 mg/dL — ABNORMAL LOW (ref 8.9–10.3)
Chloride: 112 mmol/L — ABNORMAL HIGH (ref 101–111)
GFR, EST AFRICAN AMERICAN: 59 mL/min — AB (ref >60)
GFR, EST NON AFRICAN AMERICAN: 51 mL/min — AB (ref >60)
Glucose, Bld: 178 mg/dL — ABNORMAL HIGH (ref 65–99)
POTASSIUM: 3.8 mmol/L (ref 3.5–5.1)
SODIUM: 142 mmol/L (ref 135–145)

## 2015-11-23 LAB — CULTURE, BLOOD (ROUTINE X 2)
CULTURE: NO GROWTH
Culture: NO GROWTH

## 2015-11-23 LAB — CSF CULTURE W GRAM STAIN: Culture: NO GROWTH

## 2015-11-23 LAB — GLUCOSE, CAPILLARY
GLUCOSE-CAPILLARY: 144 mg/dL — AB (ref 65–99)
GLUCOSE-CAPILLARY: 141 mg/dL — AB (ref 65–99)
Glucose-Capillary: 141 mg/dL — ABNORMAL HIGH (ref 65–99)
Glucose-Capillary: 142 mg/dL — ABNORMAL HIGH (ref 65–99)
Glucose-Capillary: 142 mg/dL — ABNORMAL HIGH (ref 65–99)
Glucose-Capillary: 204 mg/dL — ABNORMAL HIGH (ref 65–99)

## 2015-11-23 LAB — HEMOGLOBIN AND HEMATOCRIT, BLOOD
HEMATOCRIT: 30 % — AB (ref 39.0–52.0)
HEMOGLOBIN: 10.3 g/dL — AB (ref 13.0–17.0)

## 2015-11-23 LAB — PHOSPHORUS: Phosphorus: 3 mg/dL (ref 2.5–4.6)

## 2015-11-23 LAB — PROTIME-INR
INR: 1.24 (ref 0.00–1.49)
PROTHROMBIN TIME: 15.7 s — AB (ref 11.6–15.2)

## 2015-11-23 LAB — MAGNESIUM: MAGNESIUM: 1.6 mg/dL — AB (ref 1.7–2.4)

## 2015-11-23 LAB — APTT: APTT: 60 s — AB (ref 24–37)

## 2015-11-23 LAB — CSF CULTURE: SPECIAL REQUESTS: NORMAL

## 2015-11-23 MED ORDER — SODIUM CHLORIDE 0.9 % IV SOLN
Freq: Once | INTRAVENOUS | Status: AC
  Administered 2015-11-23: 23:00:00 via INTRAVENOUS

## 2015-11-23 MED ORDER — PANTOPRAZOLE SODIUM 40 MG IV SOLR
40.0000 mg | Freq: Two times a day (BID) | INTRAVENOUS | Status: DC
  Filled 2015-11-23: qty 40

## 2015-11-23 MED ORDER — SODIUM CHLORIDE 0.9 % IV SOLN
500.0000 mg | Freq: Two times a day (BID) | INTRAVENOUS | Status: DC
  Administered 2015-11-23: 500 mg via INTRAVENOUS
  Filled 2015-11-23 (×2): qty 5

## 2015-11-23 MED ORDER — SODIUM CHLORIDE 0.9 % IV SOLN
80.0000 mg | Freq: Once | INTRAVENOUS | Status: AC
  Administered 2015-11-23: 80 mg via INTRAVENOUS
  Filled 2015-11-23: qty 80

## 2015-11-23 MED ORDER — SODIUM CHLORIDE 0.9 % IV SOLN
8.0000 mg/h | INTRAVENOUS | Status: AC
  Administered 2015-11-23 – 2015-11-26 (×6): 8 mg/h via INTRAVENOUS
  Filled 2015-11-23 (×13): qty 80

## 2015-11-23 NOTE — Progress Notes (Signed)
eLink Physician-Brief Progress Note Patient Name: William Dougherty DOB: 02-13-1926 MRN: [id number]   Date of Service  11/23/2015  HPI/Events of Note  Patient has had another melenotic stool. Hgb = 10.3.   eICU Interventions  Will order: 1. D/C Protonix PO. 2. Protonix IV bolus and infusion.  3. H/H Q 6 hours.     Intervention Category Major Interventions: Other:  Sommer,Steven Dennard Nip 11/23/2015, 6:15 PM

## 2015-11-23 NOTE — Progress Notes (Addendum)
Meredith Pel MD at 1625 to inform that patient had another frank, bloody stool.  He was not able to come to the phone. Waiting on reply. Lacharles Altschuler C  Dr Arsenio Loader returned call regarding patient stools.  He will put in orders for STAT labs and continue to monitor. Annaleigha Woo C 4:39 PM

## 2015-11-23 NOTE — Procedures (Signed)
History: 82 year old male with status epilepticus now status post burst suppression  Sedation: He is on 3 antiepileptics occluding Keppra, Vimpat, phenytoin but no continuous IV sedation  Technique: This is a 21 channel continuous scalp video EEG performed at the bedside with bipolar and monopolar montages arranged in accordance to the international 10/20 system of electrode placement. This EEG recorded from 2/4 7:30 AM until 2/5 7:30 AM. One channel was dedicated to EKG recording.    Background: The background consists of low voltage irregular delta activities with occasional sleep spindle seen superimposed. With stimulation, there is an increase in theta activity. There are right frontal sharp waves (Fp2, F4, F8). These are not periodic in nature, but are relatively frequent.  There are 2 runs each lasting 100 - 120 seconds of periodic sharp activity in the right frontal region associated with some delta activity in this area. Though there is no clear evolution in frequency, there is clear evolution in amplitude and morphology.  There is mild asymmetry of sleep structures with attenuation on the right.  Photic stimulation: Physiologic driving is performed  EEG Abnormalities: 1) 2 brief electrographic seizures without clinical correlate 2) mild asymmetry of the background rhythms  Clinical Interpretation: This EEG recorded 2 brief electrographic seizures that were very focal in nature and not associated with any clinical correlate. There is also some suggestion of asymmetry consistent with a right-sided cerebral dysfunction.  Ritta Slot, MD [facility] [phone]  If 7pm- 7am, please page neurology on call as listed in AMION.

## 2015-11-23 NOTE — Progress Notes (Signed)
eLink Physician-Brief Progress Note Patient Name: William Dougherty DOB: 12/06/1925 MRN: [id number]   Date of Service  11/23/2015  HPI/Events of Note  PT/INR = 15.7/1.24, PTT = 60 and Platelets = 191K.  eICU Interventions  Will order: 1. D/C Heparin [place]. 2. Place SCD's. 3. Transfuse 2 units FFP. 4. Repeat PT/INR and PTT in AM.     Intervention Category Intermediate Interventions: Coagulopathy - evaluation and management  [name] 11/23/2015, 7:59 PM

## 2015-11-23 NOTE — Progress Notes (Signed)
Patient had bloody, watery stool.  Was given in report by Jarome Lamas, RN that patient had prior similar bloody stool yesterday and notified Dr Molli Knock at that time. Dr. Molli Knock was called to inform of recent stool.  He advised to watch and notify CCM if there are any more like occurrences. Gae Bihl C 11/23/2015 3:01 PM

## 2015-11-23 NOTE — Progress Notes (Signed)
Narrative: Two brief electrographic seizures yesterday.   Exam: Filed Vitals:   11/23/15 0900 11/23/15 0915  BP: 98/48 103/46  Pulse: 81 82  Temp:    Resp: 22 22   Gen: In bed, intubated Resp: Ventilated  Abd: soft, nt  Neuro:  MS: Does not open eyes, does not speak or follow commands CN: Left pupil postsurgical, right pupil reactive Motor: No movement Sensory: Does not respond  Pertinent Labs: Phenytoin level 18.8  Lumbar puncture 0 WBC, mildly elevated protein  Impression: 82 year old male with partial status epilepticus associated with reduced consciousness, though he was able to follow commands prior to more aggressive therapy.  I suspect this is secondary to his dementia, as seizures are not terribly uncommon with dementia and a percentage will present with status epilepticus.   CSF was performed with 0 white cells and mildly elevated protein at 50, normal IgG index. With no signs of inflammation on CSF either with pleocytosis or elevation of the IgG index, I do not think that an inflammatory cause is very likely.  He continues to be severely obtunded which I suspect is still medication effect. With two breakthrough seizures yesterday, I am hesitant to back off considerably, but will need to decrease some as I am concerned that his AEDs are contributing to his continued obtundation.   Prior to consideration of withdrawal, I would favor aggressive reduction in AEDs given that he was able to follow commands even during his seizure.   Recommendations: 1) continue Vimpat 200 twice a day, Dilantin 75 3 times a day  2) recheck Dilantin level with albumin tomorrow 3) decrease Keppra to 500 twice a day    Ritta Slot, MD [facility] [phone]  If 7pm- 7am, please page neurology on call as listed in AMION. 11/23/2015  9:44 AM

## 2015-11-23 NOTE — Progress Notes (Signed)
ABG collected  

## 2015-11-23 NOTE — Progress Notes (Signed)
Name: William Dougherty MRN: [id number] DOB: 1926/10/03    ADMISSION DATE:  11/15/2015   CONSULTATION DATE:  11/11/2015  REFERRING MD :  EDP  CHIEF COMPLAINT:  AMS, fall  HISTORY OF PRESENT ILLNESS:  William Dougherty is a 82 y.o. male with a PMH as outlined below.  He was in his USOH on evening of 11/04/2015 and after eating dinner, he was sitting at dinner table while family was in other room.  Family then heard a loud noise and when they came to dinner table, pt was lying on the floor.  EMS was dispatched and on en route to ED, pt had seizure like activity for 1 minute for which he was given  versed.  In ED, pt was hypothermic to 96.66F.  Initial lactate was 2.98 with repeat down to 0.96.  He had mild AKI (SCr 1.29).  ABG revealed hypoxemia; otherwise normal.   Head CT was negative for acute process.  Pt was minimally responsive however; therefore, he was placed on BiPAP.  Per notes, he remained minimally responsive; therefore, PCCM was called due to concern he may fail BiPAP.  During my exam, pt is pulling at mask and attempting to get it off.  Repeat lactate is down to 0.96.  Remaining labs essentially normal with exception of mild AKI.  Per family, pt has fairly significant dementia.  He is able to walk at home using a walker, but otherwise needs assistance with ADL's.  He is able to eat, but eats pureed food and requires assistance with feeding. Despite his dementia, family is certain that if needed, they would opt for short term life support / full code status.  On 02/01 patient became obtunded after receiving keppra and versed. 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 Seen by neurology and there was concern for new onset seizures/non-convulsive status epilseptics and patient's ability to maintain airway while receiving sedating medications for seizure control. PCCM was consulted for intubation and airway management.  SUBJECTIVE:   Remains on dopamine and off sedation with  no response.   VITAL SIGNS: Temp:  [98.2 F (36.8 C)-100.3 F (37.9 C)] 99 F (37.2 C) (02/05 0800) Pulse Rate:  [71-87] 81 (02/05 0800) Resp:  [12-28] 23 (02/05 0800) BP: (79-144)/(38-71) 109/46 mmHg (02/05 0800) SpO2:  [99 %-100 %] 100 % (02/05 0800) FiO2 (%):  [30 %-40 %] 30 % (02/05 0800) Weight:  [57.4 kg (126 lb 8.7 oz)] 57.4 kg (126 lb 8.7 oz) (02/05 0400)  PHYSICAL EXAMINATION: General: Off sedation, not responsive, does not withdraw to pain. Neuro: Pupils dilated, left irregular, sluggish, no corneal reflex. HEENT: Sclerae anicteric, moist mucus membranes. Cardiovascular: RRR, no MRG, no edema  Lungs: No wheezing, crackles.  Abdomen: Non-distended; soft, +BS Musculoskeletal: No gross deformities Skin: No rashes.  Recent Labs Lab 11/21/15 0622 11/22/15 0900 11/23/15 0608  NA 141 143 142  K 3.3* 4.6 3.8  CL 110 110 112*  CO2 21* 19* 21*  BUN 20 29* 37*  CREATININE 1.32* 1.54* 1.23  GLUCOSE 185* 158* 178*    Recent Labs Lab 11/21/15 0622 11/22/15 0900 11/23/15 0608  HGB 12.0* 12.7* 11.2*  HCT 34.7* 37.3* 32.1*  WBC 7.5 12.6* 12.7*  PLT 169 173 175   Dg Chest Port 1 View  11/23/2015  CLINICAL DATA:  Endotracheal tube EXAM: PORTABLE CHEST 1 VIEW COMPARISON:  11/21/2015 FINDINGS: Endotracheal tube and NG tube are unchanged. Stable cardiac silhouette. Lungs are hyperinflated. No effusion or infiltrate. IMPRESSION: Stable support apparatus.  Hyperinflated lungs.  Electronically Signed   By: Genevive Bi M.D.   On: 11/23/2015 07:52   STUDIES:  CT head and c-spine 01/31 > no acute process.  Antibiotics Vancomycin 02/01>>2/4 Zosyn 02/01>>  Cultures 1/31 Blood>>NTD Urine>>NTD CSF 02/02>NTD  SIGNIFICANT EVENTS  01/31-Found "humped over" at home>>ED> admitted with acute encephalopathy and new onset seizures. 02/01-Obtunded>>intubated  DISCUSSION: 82 YO male with dementia presenting with new onset seizures/non-convulsive status  epilepticus  ASSESSMENT / PLAN:  PULMONARY A: Acute hypoxemic respiratory failure secondary to non-convulsive status epilepticus. Possible aspiration - no hx to support; however, pt has had dysphagia for years and is unresponsvie. New right upper lobe lung opacity-no leukocytosis P: Continue PS as tolerated but no extubation given mental status. VAP prevention. Albuterol prn. Empiric abx for aspiration PNA. Daily CXR.    CARDIOVASCULAR A:  Hypotension H/O Hypertension P:  Hemodynamic monitoring per ICU protocol Dopamine gtt; titrate to MAP goal >65 at 16 mcg. Hold all home antihypertensives KVO IVF.  RENAL A:   AKI-Creatinine 1.3 Hypokalemia P:   Monitor I/O F/U BMP Hold diuretics Monitor and replace electrolytes  GASTROINTESTINAL A:   No acute issues P:   PPI for GI prophylaxis. Tube feeds per nutrition.  HEMATOLOGIC A:   No acute issues P:  Monitor CBC  INFECTIOUS A:   Low rade fever New right upper lobe lung opacity on CXR P:   Cultures negative to date Continue empiric antibiotics   ENDOCRINE A:   Hypothyroidism P:   Monitor glucose with BMP Synthroid at home dose  NEUROLOGIC A: New onset seizures/non-convulsive status epilepticus Acute encephalopathy 2/2 seizure activity Fall - unclear etiology-Syncope vs seizure; CT head negative and Seizure activity reported by EMS. R/O meningitis P:  Neurology following Neuro checks Keppra, vimpat, and dilantin per neurology. Titrate off propofol and midazolam infusions LP results; cultures ntd, no meninigitis  FAMILY  - Updates: No family bedside. - Inter-disciplinary family meet or Palliative Care meeting due by:  02/08.  The patient is critically ill with multiple organ systems failure and requires high complexity decision making for assessment and support, frequent evaluation and titration of therapies, application of advanced monitoring technologies and extensive interpretation of multiple  databases.   Critical Care Time devoted to patient care services described in this note is  35  Minutes. This time reflects time of care of this signee Dr Koren Bound. This critical care time does not reflect procedure time, or teaching time or supervisory time of PA/NP/Med student/Med Resident etc but could involve care discussion time.  Alyson Reedy, M.D. [facility] Pulmonary/Critical Care Medicine. Pager: [phone]. After hours pager: [phone].  11/23/2015, 8:53 AM

## 2015-11-23 NOTE — Progress Notes (Signed)
eLink Physician-Brief Progress Note Patient Name: William Dougherty DOB: [birth year] MRN: [id number]   Date of Service  11/23/2015  HPI/Events of Note  Patient with several melenotic stools mixed with BRB. Patient is currently on a Dopamine IV infusion for hemodynamic support and Protonix PO.  eICU Interventions  Will order: 1. CBC with platelets, PT-INR, PTT STAT.     Intervention Category Major Interventions: Other:;Hypotension - evaluation and management  Aasia Peavler Dennard Nip 11/23/2015, 4:39 PM

## 2015-11-23 NOTE — Progress Notes (Signed)
Pt back on FS at this time tolerating it well.  

## 2015-11-24 ENCOUNTER — Inpatient Hospital Stay (HOSPITAL_COMMUNITY): Payer: Medicare Other

## 2015-11-24 LAB — CSF CELL COUNT WITH DIFFERENTIAL
RBC COUNT CSF: 0 /mm3
RBC Count, CSF: 1 /mm3 — ABNORMAL HIGH
TUBE #: 4
Tube #: 1
WBC CSF: 0 /mm3 (ref 0–5)
WBC, CSF: 1 /mm3 (ref 0–5)

## 2015-11-24 LAB — GLUCOSE, CAPILLARY
Glucose-Capillary: 145 mg/dL — ABNORMAL HIGH (ref 65–99)
Glucose-Capillary: 139 mg/dL — ABNORMAL HIGH (ref 65–99)
Glucose-Capillary: 130 mg/dL — ABNORMAL HIGH (ref 65–99)
Glucose-Capillary: 111 mg/dL — ABNORMAL HIGH (ref 65–99)
Glucose-Capillary: 108 mg/dL — ABNORMAL HIGH (ref 65–99)
Glucose-Capillary: 132 mg/dL — ABNORMAL HIGH (ref 65–99)

## 2015-11-24 LAB — PROTIME-INR
INR: 1.23 (ref 0.00–1.49)
Prothrombin Time: 15.7 seconds — ABNORMAL HIGH (ref 11.6–15.2)

## 2015-11-24 LAB — HEMOGLOBIN AND HEMATOCRIT, BLOOD
HCT: 24.6 % — ABNORMAL LOW (ref 39.0–52.0)
HCT: 25.3 % — ABNORMAL LOW (ref 39.0–52.0)
HCT: 25.3 % — ABNORMAL LOW (ref 39.0–52.0)
HEMATOCRIT: 26.1 % — AB (ref 39.0–52.0)
Hemoglobin: 8.3 g/dL — ABNORMAL LOW (ref 13.0–17.0)
Hemoglobin: 8.4 g/dL — ABNORMAL LOW (ref 13.0–17.0)
Hemoglobin: 8.6 g/dL — ABNORMAL LOW (ref 13.0–17.0)
Hemoglobin: 8.7 g/dL — ABNORMAL LOW (ref 13.0–17.0)

## 2015-11-24 LAB — POCT I-STAT 3, ART BLOOD GAS (G3+)
Acid-base deficit: 1 mmol/L (ref 0.0–2.0)
BICARBONATE: 24.4 meq/L — AB (ref 20.0–24.0)
O2 Saturation: 99 %
PCO2 ART: 41.1 mmHg (ref 35.0–45.0)
PH ART: 7.38 (ref 7.350–7.450)
Patient temperature: 97.8
TCO2: 26 mmol/L (ref 0–100)
pO2, Arterial: 126 mmHg — ABNORMAL HIGH (ref 80.0–100.0)

## 2015-11-24 LAB — PHOSPHORUS: Phosphorus: 2.5 mg/dL (ref 2.5–4.6)

## 2015-11-24 LAB — BASIC METABOLIC PANEL WITH GFR
Anion gap: 9 (ref 5–15)
BUN: 45 mg/dL — ABNORMAL HIGH (ref 6–20)
CO2: 24 mmol/L (ref 22–32)
Calcium: 8.2 mg/dL — ABNORMAL LOW (ref 8.9–10.3)
Chloride: 111 mmol/L (ref 101–111)
Creatinine, Ser: 1.12 mg/dL (ref 0.61–1.24)
GFR calc Af Amer: >60 mL/min
GFR calc non Af Amer: 57 mL/min — ABNORMAL LOW
Glucose, Bld: 147 mg/dL — ABNORMAL HIGH (ref 65–99)
Potassium: 3.5 mmol/L (ref 3.5–5.1)
Sodium: 144 mmol/L (ref 135–145)

## 2015-11-24 LAB — MAGNESIUM: MAGNESIUM: 1.7 mg/dL (ref 1.7–2.4)

## 2015-11-24 LAB — APTT: APTT: 32 s (ref 24–37)

## 2015-11-24 LAB — PHENYTOIN LEVEL, TOTAL: PHENYTOIN LVL: 13.7 ug/mL (ref 10.0–20.0)

## 2015-11-24 LAB — ABO/RH: ABO/RH(D): O POS

## 2015-11-24 LAB — ALBUMIN: ALBUMIN: 2 g/dL — AB (ref 3.5–5.0)

## 2015-11-24 MED ORDER — VITAL AF 1.2 CAL PO LIQD
1000.0000 mL | ORAL | Status: DC
  Administered 2015-11-24 – 2015-11-25 (×2): 1000 mL

## 2015-11-24 MED ORDER — SODIUM CHLORIDE 0.9% FLUSH
10.0000 mL | Freq: Two times a day (BID) | INTRAVENOUS | Status: DC
  Administered 2015-11-24 – 2015-11-26 (×3): 10 mL

## 2015-11-24 MED ORDER — SODIUM CHLORIDE 0.9% FLUSH: 10.0000 mL | INTRAVENOUS | Status: DC | PRN

## 2015-11-24 MED ORDER — MAGNESIUM SULFATE 2 GM/50ML IV SOLN
2.0000 g | Freq: Once | INTRAVENOUS | Status: AC
  Administered 2015-11-24: 2 g via INTRAVENOUS
  Filled 2015-11-24: qty 50

## 2015-11-24 MED ORDER — FENTANYL CITRATE (PF) 100 MCG/2ML IJ SOLN
25.0000 ug | INTRAMUSCULAR | Status: DC | PRN
  Administered 2015-11-24: 50 ug via INTRAVENOUS

## 2015-11-24 MED ORDER — FENTANYL CITRATE (PF) 100 MCG/2ML IJ SOLN
INTRAMUSCULAR | Status: AC
  Filled 2015-11-24: qty 2

## 2015-11-24 MED ORDER — LEVETIRACETAM 100 MG/ML PO SOLN
500.0000 mg | Freq: Two times a day (BID) | ORAL | Status: DC
  Administered 2015-11-24 (×2): 500 mg
  Filled 2015-11-24 (×2): qty 5

## 2015-11-24 NOTE — Progress Notes (Signed)
Peripherally Inserted Central Catheter/Midline Placement  The IV Nurse has discussed with the patient and/or persons authorized to consent for the patient, the purpose of this procedure and the potential benefits and risks involved with this procedure.  The benefits include less needle sticks, lab draws from the catheter and patient may be discharged home with the catheter.  Risks include, but not limited to, infection, bleeding, blood clot (thrombus formation), and puncture of an artery; nerve damage and irregular heat beat.  Alternatives to this procedure were also discussed.  Consent signed by son due to altered mental status.  PICC/Midline Placement Documentation        Asa Fath, Lajean Manes 11/24/2015, 9:49 AM

## 2015-11-24 NOTE — Progress Notes (Signed)
eLink Physician-Brief Progress Note Patient Name: William Dougherty DOB: [birth year] MRN: [id number]   Date of Service  11/24/2015  HPI/Events of Note  RN contacted regarding limited IV access.   eICU Interventions  Keppra switched to via tube bid.     Intervention Category Intermediate Interventions: Medication change / dose adjustment  Lawanda Cousins 11/24/2015, 1:43 AM

## 2015-11-24 NOTE — Progress Notes (Signed)
Patient ID: William Dougherty, male   DOB: December 16, 1925, 82 y.o.   MRN: [id number]   Was walking by 35M on night shift. RN said no central access despite LOS 6 days. Also, family asking many of the same questions to different health providers  Plan - picc line ordered - ok to do in   Recent Labs Lab 11/11/2015 1906 11/20/15 0400 11/21/15 0622 11/22/15 0900 11/23/15 4098  CREATININE 1.29* 1.28* 1.32* 1.54* 1.23    Estimated Creatinine Clearance: 34.4 mL/min (by C-G formula based on Cr of 1.23).     - bedside MD > 7am to address family questions   Dr. Kalman Shan, M.D., [facility].C.P Pulmonary and Critical Care Medicine Staff Physician [facility] [place] Pulmonary and Critical Care Pager: [phone], If no answer or between  15:00h - 7:00h: call [phone]  11/24/2015 1:11 AM

## 2015-11-24 NOTE — Progress Notes (Addendum)
Name: Ebrima Ranta MRN: [id number] DOB: 03/09/1926    ADMISSION DATE:  December 07, 2015   CONSULTATION DATE:  December 07, 2015  REFERRING MD :  EDP  CHIEF COMPLAINT:  AMS, fall  HISTORY OF PRESENT ILLNESS:  Griff Badley is a 82 y.o. male with a PMH as outlined below.  He was in his USOH on evening of 12/07/15 and after eating dinner, he was sitting at dinner table while family was in other room.  Family then heard a loud noise and when they came to dinner table, pt was lying on the floor.  EMS was dispatched and on en route to ED, pt had seizure like activity for 1 minute for which he was given  versed.  In ED, pt was hypothermic to 96.6F.  Initial lactate was 2.98 with repeat down to 0.96.  He had mild AKI (SCr 1.29).  ABG revealed hypoxemia; otherwise normal.   Head CT was negative for acute process.  Pt was minimally responsive however; therefore, he was placed on BiPAP.  Per notes, he remained minimally responsive; therefore, PCCM was called due to concern he may fail BiPAP.  During my exam, pt is pulling at mask and attempting to get it off.  Repeat lactate is down to 0.96.  Remaining labs essentially normal with exception of mild AKI.  Per family, pt has fairly significant dementia.  He is able to walk at home using a walker, but otherwise needs assistance with ADL's.  He is able to eat, but eats pureed food and requires assistance with feeding. Despite his dementia, family is certain that if needed, they would opt for short term life support / full code status.  On 02/01 patient became obtunded after receiving keppra and versed. 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 Seen by neurology and there was concern for new onset seizures/non-convulsive status epilseptics and patient's ability to maintain airway while receiving sedating medications for seizure control. PCCM was consulted for intubation and airway management.  SUBJECTIVE:   Off pressors, completely unresponsive.    VITAL SIGNS: Temp:  [97.8 F (36.6 C)-98.9 F (37.2 C)] 98.5 F (36.9 C) (02/06 0400) Pulse Rate:  [76-97] 80 (02/06 0729) Resp:  [14-27] 19 (02/06 0729) BP: (87-163)/(43-81) 105/51 mmHg (02/06 0729) SpO2:  [96 %-100 %] 100 % (02/06 0729) FiO2 (%):  [30 %] 30 % (02/06 0729) Weight:  [59.1 kg (130 lb 4.7 oz)] 59.1 kg (130 lb 4.7 oz) (02/06 0400)  PHYSICAL EXAMINATION: General: Off sedation, not responsive, does not withdraw to pain. Neuro: Pupils dilated, left irregular, sluggish, no corneal reflex. HEENT: Sclerae anicteric, moist mucus membranes. Cardiovascular: RRR, no MRG, no edema  Lungs: No wheezing, crackles.  Abdomen: Non-distended; soft, +BS Musculoskeletal: No gross deformities Skin: No rashes.  Recent Labs Lab 11/22/15 0900 11/23/15 0608 11/24/15 0450  NA 143 142 144  K 4.6 3.8 3.5  CL 110 112* 111  CO2 19* 21* 24  BUN 29* 37* 45*  CREATININE 1.54* 1.23 1.12  GLUCOSE 158* 178* 147*    Recent Labs Lab 11/22/15 0900 11/23/15 0608 11/23/15 1642 11/23/15 2300 11/24/15 0450  HGB 12.7* 11.2* 10.3* 10.3* 8.7*  HCT 37.3* 32.1* 30.2* 30.0* 25.3*  WBC 12.6* 12.7* 13.0*  --   --   PLT 173 175 191  --   --    Dg Chest Port 1 View  11/24/2015  CLINICAL DATA:  Intubated patient, acute respiratory failure, encephalopathy, seizure disorder. EXAM: PORTABLE CHEST 1 VIEW COMPARISON:  Portable chest x-ray of November 23, 2015 FINDINGS: The lungs are adequately inflated. There is no focal infiltrate. Slight increased density at the left lung base persists with obscuration of the lateral costophrenic angle. The heart and pulmonary vascularity are normal. The mediastinum is normal in width. The trachea is midline. The endotracheal tube tip lies 4.1 cm above the carina. The esophagogastric tube tip and proximal port lie well below the GE junction. IMPRESSION: Fairly stable appearance of the chest. Persistent mild left lower lobe atelectasis and trace left pleural effusion. The  support tubes are in reasonable position. Electronically Signed   By: David  [place] M.D.   On: 11/24/2015 07:53   Dg Chest Port 1 View  11/23/2015  CLINICAL DATA:  Endotracheal tube EXAM: PORTABLE CHEST 1 VIEW COMPARISON:  11/21/2015 FINDINGS: Endotracheal tube and NG tube are unchanged. Stable cardiac silhouette. Lungs are hyperinflated. No effusion or infiltrate. IMPRESSION: Stable support apparatus.  Hyperinflated lungs. Electronically Signed   By: Genevive Bi M.D.   On: 11/23/2015 07:52   STUDIES:  CT head and c-spine 01/31 > no acute process.  Antibiotics Vancomycin 02/01>>2/4 Zosyn 02/01>>  Cultures 1/31 Blood>>NTD Urine>>NTD CSF 02/02>NTD  SIGNIFICANT EVENTS  01/31-Found "humped over" at home>>ED> admitted with acute encephalopathy and new onset seizures. 02/01-Obtunded>>intubated  DISCUSSION: 82 YO male with dementia presenting with new onset seizures/non-convulsive status epilepticus  ASSESSMENT / PLAN:  PULMONARY A: Acute hypoxemic respiratory failure secondary to non-convulsive status epilepticus. Possible aspiration - no hx to support; however, pt has had dysphagia for years and is unresponsvie. New right upper lobe lung opacity-no leukocytosis P: Continue PS as tolerated but no extubation given mental status. VAP prevention. Albuterol prn. Empiric abx for aspiration PNA. Daily CXR.    CARDIOVASCULAR A:  Hypotension H/O Hypertension P:  Hemodynamic monitoring per ICU protocol. Dopamine gtt off at this point. Hold all home antihypertensives. KVO IVF.  RENAL A:   AKI-Creatinine 1.3 Hypokalemia P:   Monitor I/O. F/U BMP. Hold diuretics. Monitor and replace electrolytes.  GASTROINTESTINAL A:   Lower GI bleeding overnight. P:   PPI for GI prophylaxis. Tube feeds per nutrition. GI consult to [facility] called.  HEMATOLOGIC A:   Hg drop by 2 gms overnight. P:  Monitor CBC. Transfuse for target of 7.  INFECTIOUS A:   Low rade  fever New right upper lobe lung opacity on CXR P:   Cultures negative to date. Continue empiric antibiotics, plan to stop at day 8.  ENDOCRINE A:   Hypothyroidism P:   Monitor glucose with BMP. Synthroid at home dose.  NEUROLOGIC A: New onset seizures/non-convulsive status epilepticus Acute encephalopathy 2/2 seizure activity Fall - unclear etiology-Syncope vs seizure; CT head negative and Seizure activity reported by EMS. R/O meningitis P:  Neurology following Neuro checks Keppra, vimpat, and dilantin per neurology. D/C all sedation. LP results; cultures ntd, no meninigitis  FAMILY  - Updates: Family updated VERY MUCH at length in the conference room. - Inter-disciplinary family meet or Palliative Care meeting due by:  02/08.  The patient is critically ill with multiple organ systems failure and requires high complexity decision making for assessment and support, frequent evaluation and titration of therapies, application of advanced monitoring technologies and extensive interpretation of multiple databases.   Critical Care Time devoted to patient care services described in this note is  35  Minutes. This time reflects time of care of this signee Dr Koren Bound. This critical care time does not reflect procedure time, or teaching time or supervisory time of PA/NP/Med student/Med  Resident etc but could involve care discussion time.  Alyson Reedy, M.D. [facility] Pulmonary/Critical Care Medicine. Pager: [phone]. After hours pager: [phone].  11/24/2015, 8:32 AM

## 2015-11-24 NOTE — Progress Notes (Signed)
ABG collected  

## 2015-11-24 NOTE — Plan of Care (Signed)
Problem: Phase I Progression Outcomes Goal: GIProphysixis Outcome: Completed/Met Date Met:  11/24/15 Protonix IV, drip

## 2015-11-24 NOTE — Progress Notes (Signed)
Pt back on FS at this time tolerating it well.  

## 2015-11-24 NOTE — Progress Notes (Signed)
Narrative: No noted clinical seizure activity.   Corrected albumin 27.4  Exam: Filed Vitals:   11/24/15 0700 11/24/15 0729  BP: 104/46 105/51  Pulse: 81 80  Temp:    Resp: 16 19   Gen: In bed, intubated Resp: Ventilated  Abd: soft, nt  Neuro:  MS: Does not open eyes, does not speak or follow commands CN: Left pupil postsurgical, right pupil reactive Motor: No movement Sensory: Does not respond  Lumbar puncture 0 WBC, mildly elevated protein  Impression: 82 year old male with partial status epilepticus associated with reduced consciousness, though he was able to follow commands prior to more aggressive therapy.  I suspect this is secondary to his dementia, as seizures are not terribly uncommon with dementia and a percentage will present with status epilepticus.   CSF was performed with 0 white cells and mildly elevated protein at 50, normal IgG index. With no signs of inflammation on CSF either with pleocytosis or elevation of the IgG index, I do not think that an inflammatory cause is very likely.  He continues to be severely obtunded which I suspect is still medication effect. Corrected dilantin level is supratherapeutic. With age and baseline dementia concern that he may not get back to prior baseline.   Recommendations: 1) continue Vimpat 200 twice a day 2) stop dilantin 3) Continue keppra for now, if seizure free with plan to d/c tomorrow   Elspeth Cho, DO [facility] [phone]  If 7pm- 7am, please page neurology on call as listed in AMION.   If 7pm- 7am, please page neurology on call as listed in AMION. 11/24/2015  8:57 AM

## 2015-11-24 NOTE — Progress Notes (Signed)
eLink Pharmacist-Brief Progress Note Patient Name: William Dougherty DOB: May 25, 1926 MRN: [id number]   Date of Service  11/24/2015  HPI/Events of Note  Electrolytes evaluated. Mg 1.6>1.7  eICU Interventions  Mg IV 2g x 1 ordered   eLink Provider: Blenda Bridegroom, PharmD 11/24/2015, 3:23 PM

## 2015-11-24 NOTE — Progress Notes (Signed)
Nutrition Follow-up  DOCUMENTATION CODES:   Severe malnutrition in context of chronic illness  INTERVENTION:   D/C Vital High Protein Initiate Vital AF 1.2 @ 50 ml/hr via OG tube   Tube feeding regimen provides 1440 kcal (104% of needs), 90 grams of protein, and 973 ml of H2O.   NUTRITION DIAGNOSIS:   Malnutrition related to chronic illness (dementia) as evidenced by severe depletion of body fat, severe depletion of muscle mass. Ongoing.   GOAL:   Patient will meet greater than or equal to 90% of their needs Not met.   MONITOR:   TF tolerance, Skin, Vent status, Labs  ASSESSMENT:   82 YO male with dementia lives with son and daughter in law and is required total care presenting with new onset seizures/non-convulsive status epilepticus  Patient is currently intubated on ventilator support MV: 6.5 L/min Temp (24hrs), Avg:98.3 F (36.8 C), Min:97.8 F (36.6 C), Max:98.9 F (37.2 C)  Propofol: off Labs reviewed: potassium/magnesium/phosphours are WNL CBG's: 139-145 OG tube  Vital High Protein @ 40 ml/hr providing 960 kcal and 84 grams protein. Now that propofol is off not meeting kcal needs.  Pt discussed during ICU rounds and with RN.  Concern for GI bleed with blood stools, GI consult pending.   Diet Order:  Diet NPO time specified  Skin:  Wound (see comment) (Deep tissue injury bilateral buttocks)  Last BM:  2/6 bloody  Height:   Ht Readings from Last 1 Encounters:  11/19/15 5' 4"  (1.626 m)   Weight:   Wt Readings from Last 1 Encounters:  11/24/15 130 lb 4.7 oz (59.1 kg)   Ideal Body Weight:  59 kg  BMI:  Body mass index is 22.35 kg/(m^2).  Estimated Nutritional Needs:   Kcal:  8718  Protein:  85-95 grams  Fluid:  > 1.5 L/day  EDUCATION NEEDS:   No education needs identified at this time  [place], [place], [place] Pager [phone] After Hours Pager

## 2015-11-24 NOTE — Progress Notes (Signed)
Pharmacy Antibiotic Note  William Dougherty is a 82 y.o. male admitted on 01-Dec-2015 with sepsis.  Pharmacy has been consulted for zosyn dosing. Today is D#7 for therapy. Pt is afebrile and WBC is elevated at 13 as of 2/5. Scr is improved to 1.12. Doses remain appropriate  Plan: - Continue zosyn 3.375gm IV Q8H (4 hr inf) - F/u renal fxn, C&S, clinical status - MD - Please address planned LOT *Pharmacy will sign-off as no further dose adjustments anticipated. Thank you for the consult!  Height:  (162.6 cm) Weight: 130 lb 4.7 oz (59.1 kg) IBW/kg (Calculated) : 59.2  Temp (24hrs), Avg:98.3 F (36.8 C), Min:97.8 F (36.6 C), Max:99 F (37.2 C)   Recent Labs Lab 2015/12/01 1900  2015-12-01 2123 11/19/15 0037 11/20/15 0400 11/21/15 0622 11/22/15 0900 11/23/15 0608 11/23/15 1642 11/24/15 0450  WBC  --   < >  --   --  9.2 7.5 12.6* 12.7* 13.0*  --   CREATININE 1.20  < >  --   --  1.28* 1.32* 1.54* 1.23  --  1.12  LATICACIDVEN 2.98*  --  0.96 1.31  --   --   --   --   --   --   < > = values in this interval not displayed.  Estimated Creatinine Clearance: 38.8 mL/min (by C-G formula based on Cr of 1.12).    Allergies  Allergen Reactions  . Eggs Or Egg-Derived Products Diarrhea and Nausea Only    Antimicrobials this admission: Vanc 1/31 >>  Zosyn 1/31 >>   Dose adjustments this admission: N/A  Microbiology results: 1/31 Urine - NEG 1/31 Blood - NGTD 2/1 MRSA - NEG 1/31 Flu - NEG  Lysle Pearl, PharmD, BCPS Pager # [phone] 11/24/2015 7:27 AM

## 2015-11-24 NOTE — Consult Note (Addendum)
Reason for Consult: Rectal bleeding and anemia. Referring Physician: CCM  William Dougherty is an 82 y.o. male.  HPI: William Dougherty is a 82 year old [place] male with multiple medical problems listed below. I have seen him in the remote past for a screening colonoscopy in 2008 when he was found to have a tubular adenomas. He has not been seen in our office since 02/2008. As per my discussion with his family, for the last year he has had progressive dysphagia for liquids like water or milk but can swallow thick liquids. This was apparently never worked up. He also suffers from chronic constipation and has 1 BM per week. He has been very limited in his mobility and walks with a walker. Last week, on 11/07/2015 he was sitting at the dining table when he slumped over and reportedly had a seizure. The family heard a loud sound and when they came to dinner table, pt was lying on the floor. EMS was dispatched and on en route to ER, pt had seizure like activity for 1 minute for which he was given 32m versed. In the ER, pt was hypothermic to 96.62F. Initial lactate was 2.98 with repeat level down to 0.96.The CT of the head CT was negative for acute process. His seizures continued in the hospital and he was hospitalized for acute respiratory failure requiring ventilation and pressors. Patient has a history of dementia. He is able to walk at home using a walker, but otherwise needs assistance with ADL's. He is able to eat, but eats pureed food and requires assistance with feeding. On 11/19/15 patient became obtunded after receiving keppra and versed. 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 Seen by neurology and there was concern for new onset seizures/non-convulsive status epilepticus. His situation has steadily deteriorated since then and he has been obtunded with no responses for several days now. He had some hematochezia on 11/22/15 and some ?melenic stools mixed with some BRB yesterday when he  received 2 units of PRBC's and a Protonix drip was started. He has not had any GI bleeding today.   Past Medical History  Diagnosis Date  . Hypertension   . Hypothyroidism   . BPH (benign prostatic hyperplasia)   . Glaucoma   . GERD (gastroesophageal reflux disease)   . Knee pain, bilateral   . Bipolar 1 disorder ([place]   . Hyperpigmentation     left maxilla  . Dementia    Past Surgical History  Procedure Laterality Date  . None     Family History  Problem Relation Age of Onset  . Hypothyroidism Son   . Vitiligo Son   . Hashimoto's thyroiditis Grandchild    Social History:  reports that he quit smoking about 22 years ago. He has never used smokeless tobacco. He reports that he does not drink alcohol or use illicit drugs.  Allergies:  Allergies  Allergen Reactions  . Eggs Or Egg-Derived Products Diarrhea and Nausea Only   Medications: I have reviewed the patient's current medications.  Results for orders placed or performed during the hospital encounter of 11/05/2015 (from the past 48 hour(s))  Glucose, capillary     Status: Abnormal   Collection Time: 11/22/15  7:14 PM  Result Value Ref Range   Glucose-Capillary 174 (H) 65 - 99 mg/dL  Glucose, capillary     Status: Abnormal   Collection Time: 11/22/15 11:37 PM  Result Value Ref Range   Glucose-Capillary 137 (H) 65 - 99 mg/dL  Glucose, capillary  Status: Abnormal   Collection Time: 11/23/15  3:09 AM  Result Value Ref Range   Glucose-Capillary 141 (H) 65 - 99 mg/dL  Blood gas, arterial     Status: Abnormal   Collection Time: 11/23/15  3:40 AM  Result Value Ref Range   FIO2 0.30    Delivery systems VENTILATOR    Mode PRESSURE REGULATED VOLUME CONTROL    VT 480 mL   LHR 16 resp/min   Peep/cpap 5.0 cm H20   pH, Arterial 7.417 7.350 - 7.450   pCO2 arterial 32.6 (L) 35.0 - 45.0 mmHg   pO2, Arterial 118 (H) 80.0 - 100.0 mmHg   Bicarbonate 20.7 20.0 - 24.0 mEq/L   TCO2 21.7 0 - 100 mmol/L   Acid-base deficit 3.1  (H) 0.0 - 2.0 mmol/L   O2 Saturation 98.6 %   Patient temperature 98.2    Collection site LEFT RADIAL    Drawn by [phone]    Sample type ARTERIAL DRAW   Procalcitonin     Status: None   Collection Time: 11/23/15  6:08 AM  Result Value Ref Range   Procalcitonin 0.32 ng/mL    Comment:        Interpretation: PCT (Procalcitonin) <= 0.5 ng/mL: Systemic infection (sepsis) is not likely. Local bacterial infection is possible. (NOTE)         ICU PCT Algorithm               Non ICU PCT Algorithm    ----------------------------     ------------------------------         PCT < 0.25 ng/mL                 PCT < 0.1 ng/mL     Stopping of antibiotics            Stopping of antibiotics       strongly encouraged.               strongly encouraged.    ----------------------------     ------------------------------       PCT level decrease by               PCT < 0.25 ng/mL       >= 80% from peak PCT       OR PCT 0.25 - 0.5 ng/mL          Stopping of antibiotics                                             encouraged.     Stopping of antibiotics           encouraged.    ----------------------------     ------------------------------       PCT level decrease by              PCT >= 0.25 ng/mL       < 80% from peak PCT        AND PCT >= 0.5 ng/mL            Continuin g antibiotics                                              encouraged.       Continuing  antibiotics            encouraged.    ----------------------------     ------------------------------     PCT level increase compared          PCT > 0.5 ng/mL         with peak PCT AND          PCT >= 0.5 ng/mL             Escalation of antibiotics                                          strongly encouraged.      Escalation of antibiotics        strongly encouraged.   CBC     Status: Abnormal   Collection Time: 11/23/15  6:08 AM  Result Value Ref Range   WBC 12.7 (H) 4.0 - 10.5 K/uL   RBC 3.49 (L) 4.22 - 5.81 MIL/uL   Hemoglobin 11.2 (L) 13.0  - 17.0 g/dL   HCT 32.1 (L) 39.0 - 52.0 %   MCV 92.0 78.0 - 100.0 fL   MCH 32.1 26.0 - 34.0 pg   MCHC 34.9 30.0 - 36.0 g/dL   RDW 14.6 11.5 - 15.5 %   Platelets 175 150 - 400 K/uL  Basic metabolic panel     Status: Abnormal   Collection Time: 11/23/15  6:08 AM  Result Value Ref Range   Sodium 142 135 - 145 mmol/L   Potassium 3.8 3.5 - 5.1 mmol/L   Chloride 112 (H) 101 - 111 mmol/L   CO2 21 (L) 22 - 32 mmol/L   Glucose, Bld 178 (H) 65 - 99 mg/dL   BUN 37 (H) 6 - 20 mg/dL   Creatinine, Ser 1.23 0.61 - 1.24 mg/dL   Calcium 7.9 (L) 8.9 - 10.3 mg/dL   GFR calc non Af Amer 51 (L) >60 mL/min   GFR calc Af Amer 59 (L) >60 mL/min    Comment: (NOTE) The eGFR has been calculated using the CKD EPI equation. This calculation has not been validated in all clinical situations. eGFR's persistently <60 mL/min signify possible Chronic Kidney Disease.    Anion gap 9 5 - 15  Magnesium     Status: Abnormal   Collection Time: 11/23/15  6:08 AM  Result Value Ref Range   Magnesium 1.6 (L) 1.7 - 2.4 mg/dL  Phosphorus     Status: None   Collection Time: 11/23/15  6:08 AM  Result Value Ref Range   Phosphorus 3.0 2.5 - 4.6 mg/dL  Glucose, capillary     Status: Abnormal   Collection Time: 11/23/15  8:00 AM  Result Value Ref Range   Glucose-Capillary 142 (H) 65 - 99 mg/dL  Glucose, capillary     Status: Abnormal   Collection Time: 11/23/15 11:34 AM  Result Value Ref Range   Glucose-Capillary 144 (H) 65 - 99 mg/dL  Glucose, capillary     Status: Abnormal   Collection Time: 11/23/15  4:13 PM  Result Value Ref Range   Glucose-Capillary 204 (H) 65 - 99 mg/dL  CBC with Differential/Platelet     Status: Abnormal   Collection Time: 11/23/15  4:42 PM  Result Value Ref Range   WBC 13.0 (H) 4.0 - 10.5 K/uL   RBC 3.27 (L) 4.22 - 5.81 MIL/uL   Hemoglobin 10.3 (L) 13.0 - 17.0  g/dL   HCT 30.2 (L) 39.0 - 52.0 %   MCV 92.4 78.0 - 100.0 fL   MCH 31.5 26.0 - 34.0 pg   MCHC 34.1 30.0 - 36.0 g/dL   RDW 14.8  11.5 - 15.5 %   Platelets 191 150 - 400 K/uL   Neutrophils Relative % 86 %   Neutro Abs 11.2 (H) 1.7 - 7.7 K/uL   Lymphocytes Relative 7 %   Lymphs Abs 0.9 0.7 - 4.0 K/uL   Monocytes Relative 7 %   Monocytes Absolute 0.9 0.1 - 1.0 K/uL   Eosinophils Relative 0 %   Eosinophils Absolute 0.0 0.0 - 0.7 K/uL   Basophils Relative 0 %   Basophils Absolute 0.0 0.0 - 0.1 K/uL  APTT     Status: Abnormal   Collection Time: 11/23/15  4:42 PM  Result Value Ref Range   aPTT 60 (H) 24 - 37 seconds    Comment:        IF BASELINE aPTT IS ELEVATED, SUGGEST PATIENT RISK ASSESSMENT BE USED TO DETERMINE APPROPRIATE ANTICOAGULANT THERAPY.   Protime-INR     Status: Abnormal   Collection Time: 11/23/15  4:42 PM  Result Value Ref Range   Prothrombin Time 15.7 (H) 11.6 - 15.2 seconds   INR 1.24 0.00 - 1.49  Glucose, capillary     Status: Abnormal   Collection Time: 11/23/15  7:26 PM  Result Value Ref Range   Glucose-Capillary 141 (H) 65 - 99 mg/dL  Prepare fresh frozen plasma     Status: None (Preliminary result)   Collection Time: 11/23/15  8:05 PM  Result Value Ref Range   Unit Number [id number]    Blood Component Type THW PLS APHR    Unit division 00    Status of Unit ISSUED    Transfusion Status OK TO TRANSFUSE    Unit Number [id number]    Blood Component Type THAWED PLASMA    Unit division 00    Status of Unit ISSUED,FINAL    Transfusion Status OK TO TRANSFUSE   Type and screen [place]     Status: None   Collection Time: 11/23/15  8:33 PM  Result Value Ref Range   ABO/RH(D) O POS    Antibody Screen NEG    Sample Expiration 11/26/2015   ABO/Rh     Status: None   Collection Time: 11/23/15  8:33 PM  Result Value Ref Range   ABO/RH(D) O POS   Hemoglobin and hematocrit, blood     Status: Abnormal   Collection Time: 11/23/15 11:00 PM  Result Value Ref Range   Hemoglobin 10.3 (L) 13.0 - 17.0 g/dL   HCT 30.0 (L) 39.0 - 52.0 %  Glucose, capillary     Status:  Abnormal   Collection Time: 11/23/15 11:50 PM  Result Value Ref Range   Glucose-Capillary 142 (H) 65 - 99 mg/dL  I-STAT 3, arterial blood gas (G3+)     Status: Abnormal   Collection Time: 11/24/15  3:16 AM  Result Value Ref Range   pH, Arterial 7.380 7.350 - 7.450   pCO2 arterial 41.1 35.0 - 45.0 mmHg   pO2, Arterial 126.0 (H) 80.0 - 100.0 mmHg   Bicarbonate 24.4 (H) 20.0 - 24.0 mEq/L   TCO2 26 0 - 100 mmol/L   O2 Saturation 99.0 %   Acid-base deficit 1.0 0.0 - 2.0 mmol/L   Patient temperature 97.8 F    Sample type ARTERIAL   Glucose, capillary  Status: Abnormal   Collection Time: 11/24/15  3:43 AM  Result Value Ref Range   Glucose-Capillary 145 (H) 65 - 99 mg/dL  Basic metabolic panel     Status: Abnormal   Collection Time: 11/24/15  4:50 AM  Result Value Ref Range   Sodium 144 135 - 145 mmol/L   Potassium 3.5 3.5 - 5.1 mmol/L   Chloride 111 101 - 111 mmol/L   CO2 24 22 - 32 mmol/L   Glucose, Bld 147 (H) 65 - 99 mg/dL   BUN 45 (H) 6 - 20 mg/dL   Creatinine, Ser 1.12 0.61 - 1.24 mg/dL   Calcium 8.2 (L) 8.9 - 10.3 mg/dL   GFR calc non Af Amer 57 (L) >60 mL/min   GFR calc Af Amer >60 >60 mL/min    Comment: (NOTE) The eGFR has been calculated using the CKD EPI equation. This calculation has not been validated in all clinical situations. eGFR's persistently <60 mL/min signify possible Chronic Kidney Disease.    Anion gap 9 5 - 15  Magnesium     Status: None   Collection Time: 11/24/15  4:50 AM  Result Value Ref Range   Magnesium 1.7 1.7 - 2.4 mg/dL  Phosphorus     Status: None   Collection Time: 11/24/15  4:50 AM  Result Value Ref Range   Phosphorus 2.5 2.5 - 4.6 mg/dL  Phenytoin level, total     Status: None   Collection Time: 11/24/15  4:50 AM  Result Value Ref Range   Phenytoin Lvl 13.7 10.0 - 20.0 ug/mL  Albumin     Status: Abnormal   Collection Time: 11/24/15  4:50 AM  Result Value Ref Range   Albumin 2.0 (L) 3.5 - 5.0 g/dL  Hemoglobin and hematocrit, blood      Status: Abnormal   Collection Time: 11/24/15  4:50 AM  Result Value Ref Range   Hemoglobin 8.7 (L) 13.0 - 17.0 g/dL   HCT 25.3 (L) 39.0 - 52.0 %  Protime-INR     Status: Abnormal   Collection Time: 11/24/15  4:50 AM  Result Value Ref Range   Prothrombin Time 15.7 (H) 11.6 - 15.2 seconds   INR 1.23 0.00 - 1.49  APTT     Status: None   Collection Time: 11/24/15  4:50 AM  Result Value Ref Range   aPTT 32 24 - 37 seconds  Glucose, capillary     Status: Abnormal   Collection Time: 11/24/15 10:01 AM  Result Value Ref Range   Glucose-Capillary 139 (H) 65 - 99 mg/dL   Comment 1 Notify RN    Comment 2 Document in Chart   Hemoglobin and hematocrit, blood     Status: Abnormal   Collection Time: 11/24/15 11:35 AM  Result Value Ref Range   Hemoglobin 8.3 (L) 13.0 - 17.0 g/dL   HCT 24.6 (L) 39.0 - 52.0 %  Glucose, capillary     Status: Abnormal   Collection Time: 11/24/15 12:04 PM  Result Value Ref Range   Glucose-Capillary 130 (H) 65 - 99 mg/dL   Comment 1 Notify RN    Comment 2 Document in Chart   Glucose, capillary     Status: Abnormal   Collection Time: 11/24/15  4:04 PM  Result Value Ref Range   Glucose-Capillary 111 (H) 65 - 99 mg/dL   Comment 1 Notify RN    Comment 2 Document in Chart    Dg Chest Port 1 View  11/24/2015  CLINICAL DATA:  Intubated  patient, acute respiratory failure, encephalopathy, seizure disorder. EXAM: PORTABLE CHEST 1 VIEW COMPARISON:  Portable chest x-ray of November 23, 2015 FINDINGS: The lungs are adequately inflated. There is no focal infiltrate. Slight increased density at the left lung base persists with obscuration of the lateral costophrenic angle. The heart and pulmonary vascularity are normal. The mediastinum is normal in width. The trachea is midline. The endotracheal tube tip lies 4.1 cm above the carina. The esophagogastric tube tip and proximal port lie well below the GE junction. IMPRESSION: Fairly stable appearance of the chest. Persistent mild  left lower lobe atelectasis and trace left pleural effusion. The support tubes are in reasonable position. Electronically Signed   By: David  [place] M.D.   On: 11/24/2015 07:53   Dg Chest Port 1 View  11/23/2015  CLINICAL DATA:  Endotracheal tube EXAM: PORTABLE CHEST 1 VIEW COMPARISON:  11/21/2015 FINDINGS: Endotracheal tube and NG tube are unchanged. Stable cardiac silhouette. Lungs are hyperinflated. No effusion or infiltrate. IMPRESSION: Stable support apparatus.  Hyperinflated lungs. Electronically Signed   By: Suzy Bouchard M.D.   On: 11/23/2015 07:52   Review of Systems  Unable to perform ROS  Blood pressure 123/70, pulse 76, temperature 97.5 F (36.4 C), temperature source Axillary, resp. rate 19, height 5' 4"  (1.626 m), weight 59.1 kg (130 lb 4.7 oz), SpO2 100 %. Physical Exam  Constitutional: He appears well-developed and well-nourished.  Intubated, obtunded, non-responsibve  HENT:  Head: Normocephalic and atraumatic.  Eyes: EOM are normal.  Cardiovascular: Normal rate and regular rhythm.   Respiratory: Breath sounds normal.  GI: Soft. He exhibits no distension. There is no tenderness. There is no rebound and no guarding.  Skin: Skin is warm and dry.   Assessment/Plan: 1) Rectal bleeding and anemia-[hemoglobin 12.7-10.3 gm/dl]/GERD-probably due to stress ulceration-on Protonix drip. The family is divided about palliative care but they have now all come to the conclusion that recovery back to baseline is not possible for the patient. His son is the only one who wants to wait for a couple of days. They do not want any invasive procedures done. 2) Malnutrition/FFT-on TF's. 3) Acute hypoxic brain injury/status epilepticus/history of dementia.  4) AKI.  5) Low grade fevers-new lung opacity-on BSA.  6) History of HTN-currently hypotensive-off of pressors today. 7) Hypothyroidism.  8) History of bipolar disorder.  William Dougherty 11/24/2015, 4:30 PM

## 2015-11-25 ENCOUNTER — Inpatient Hospital Stay (HOSPITAL_COMMUNITY): Payer: Medicare Other

## 2015-11-25 DIAGNOSIS — Z01818 Encounter for other preprocedural examination: Secondary | ICD-10-CM

## 2015-11-25 DIAGNOSIS — I10 Essential (primary) hypertension: Secondary | ICD-10-CM

## 2015-11-25 LAB — BLOOD GAS, ARTERIAL
Acid-Base Excess: 0.1 mmol/L (ref 0.0–2.0)
BICARBONATE: 24.3 meq/L — AB (ref 20.0–24.0)
Drawn by: 44956
FIO2: 0.3
LHR: 16 {breaths}/min
MECHVT: 0.48 mL
O2 Saturation: 98.6 %
PEEP: 5 cmH2O
PO2 ART: 130 mmHg — AB (ref 80.0–100.0)
Patient temperature: 98.6
TCO2: 25.5 mmol/L (ref 0–100)
pCO2 arterial: 40.2 mmHg (ref 35.0–45.0)
pH, Arterial: 7.398 (ref 7.350–7.450)

## 2015-11-25 LAB — PREPARE FRESH FROZEN PLASMA
UNIT DIVISION: 0
Unit division: 0

## 2015-11-25 LAB — CBC
HCT: 24.8 % — ABNORMAL LOW (ref 39.0–52.0)
HEMOGLOBIN: 8.1 g/dL — AB (ref 13.0–17.0)
MCH: 30.1 pg (ref 26.0–34.0)
MCHC: 32.7 g/dL (ref 30.0–36.0)
MCV: 92.2 fL (ref 78.0–100.0)
Platelets: 182 10*3/uL (ref 150–400)
RBC: 2.69 MIL/uL — AB (ref 4.22–5.81)
RDW: 15.2 % (ref 11.5–15.5)
WBC: 9.5 10*3/uL (ref 4.0–10.5)

## 2015-11-25 LAB — BASIC METABOLIC PANEL
Anion gap: 9 (ref 5–15)
BUN: 48 mg/dL — ABNORMAL HIGH (ref 6–20)
CHLORIDE: 109 mmol/L (ref 101–111)
CO2: 26 mmol/L (ref 22–32)
Calcium: 8.3 mg/dL — ABNORMAL LOW (ref 8.9–10.3)
Creatinine, Ser: 1.03 mg/dL (ref 0.61–1.24)
Glucose, Bld: 158 mg/dL — ABNORMAL HIGH (ref 65–99)
POTASSIUM: 3.4 mmol/L — AB (ref 3.5–5.1)
SODIUM: 144 mmol/L (ref 135–145)

## 2015-11-25 LAB — GLUCOSE, CAPILLARY
GLUCOSE-CAPILLARY: 125 mg/dL — AB (ref 65–99)
Glucose-Capillary: 135 mg/dL — ABNORMAL HIGH (ref 65–99)
Glucose-Capillary: 128 mg/dL — ABNORMAL HIGH (ref 65–99)
Glucose-Capillary: 119 mg/dL — ABNORMAL HIGH (ref 65–99)
Glucose-Capillary: 123 mg/dL — ABNORMAL HIGH (ref 65–99)
Glucose-Capillary: 134 mg/dL — ABNORMAL HIGH (ref 65–99)

## 2015-11-25 LAB — MAGNESIUM: MAGNESIUM: 2.1 mg/dL (ref 1.7–2.4)

## 2015-11-25 LAB — PHOSPHORUS: PHOSPHORUS: 1.9 mg/dL — AB (ref 2.5–4.6)

## 2015-11-25 MED ORDER — SODIUM PHOSPHATE 3 MMOLE/ML IV SOLN
20.0000 mmol | Freq: Once | INTRAVENOUS | Status: AC
  Administered 2015-11-25: 20 mmol via INTRAVENOUS
  Filled 2015-11-25: qty 6.67

## 2015-11-25 MED ORDER — POTASSIUM CHLORIDE 20 MEQ/15ML (10%) PO SOLN
20.0000 meq | ORAL | Status: AC
  Administered 2015-11-25 (×2): 20 meq
  Filled 2015-11-25 (×2): qty 15

## 2015-11-25 NOTE — Progress Notes (Signed)
Called CCM to let MD know that blood noted in tube of foley catheter. Reported most recent Hbg of 8.1. No new orders at this time.

## 2015-11-25 NOTE — Progress Notes (Signed)
Name: William Dougherty MRN: [id number] DOB: 1926/03/28    ADMISSION DATE:  2015/11/25   CONSULTATION DATE:  11/25/15  REFERRING MD :  EDP  CHIEF COMPLAINT:  AMS, fall  HISTORY OF PRESENT ILLNESS:  William Dougherty is a 82 y.o. male with a PMH as outlined below.  He was in his USOH on evening of 11-25-2015 and after eating dinner, he was sitting at dinner table while family was in other room.  Family then heard a loud noise and when they came to dinner table, pt was lying on the floor.  EMS was dispatched and on en route to ED, pt had seizure like activity for 1 minute for which he was given 5mg  versed.  In ED, pt was hypothermic to 96.83F.  Initial lactate was 2.98 with repeat down to 0.96.  He had mild AKI (SCr 1.29).  ABG revealed hypoxemia; otherwise normal.   Head CT was negative for acute process.  Pt was minimally responsive however; therefore, he was placed on BiPAP.  Per notes, he remained minimally responsive; therefore, PCCM was called due to concern he may fail BiPAP.  During my exam, pt is pulling at mask and attempting to get it off.  Repeat lactate is down to 0.96.  Remaining labs essentially normal with exception of mild AKI.  Per family, pt has fairly significant dementia.  He is able to walk at home using a walker, but otherwise needs assistance with ADL's.  He is able to eat, but eats pureed food and requires assistance with feeding. Despite his dementia, family is certain that if needed, they would opt for short term life support / full code status.  On 02/01 patient became obtunded after receiving keppra and versed. 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 Seen by neurology and there was concern for new onset seizures/non-convulsive status epilseptics and patient's ability to maintain airway while receiving sedating medications for seizure control. PCCM was consulted for intubation and airway management.  SUBJECTIVE:   Off pressors, completely unresponsive, no  events overnight.   VITAL SIGNS: Temp:  [97.4 F (36.3 C)-98.2 F (36.8 C)] 98.2 F (36.8 C) (02/07 0800) Pulse Rate:  [64-83] 74 (02/07 0930) Resp:  [14-21] 17 (02/07 0930) BP: (99-134)/(45-70) 124/58 mmHg (02/07 0930) SpO2:  [98 %-100 %] 100 % (02/07 0930) FiO2 (%):  [30 %] 30 % (02/07 0805) Weight:  [60.5 kg (133 lb 6.1 oz)] 60.5 kg (133 lb 6.1 oz) (02/07 0500)  PHYSICAL EXAMINATION: General: Off sedation, not responsive, does not withdraw to pain. Neuro: Pupils dilated, left irregular, sluggish, no corneal reflex. HEENT: Sclerae anicteric, moist mucus membranes. Cardiovascular: RRR, no MRG, no edema  Lungs: No wheezing, crackles.  Abdomen: Non-distended; soft, +BS Musculoskeletal: No gross deformities Skin: No rashes.  Recent Labs Lab 11/23/15 0608 11/24/15 0450 11/25/15 0545  NA 142 144 144  K 3.8 3.5 3.4*  CL 112* 111 109  CO2 21* 24 26  BUN 37* 45* 48*  CREATININE 1.23 1.12 1.03  GLUCOSE 178* 147* 158*    Recent Labs Lab 11/23/15 0608 11/23/15 1642  11/24/15 1816 11/24/15 2345 11/25/15 0545  HGB 11.2* 10.3*  < > 8.6* 8.4* 8.1*  HCT 32.1* 30.2*  < > 26.1* 25.3* 24.8*  WBC 12.7* 13.0*  --   --   --  9.5  PLT 175 191  --   --   --  182  < > = values in this interval not displayed. Dg Chest Port 1 View  11/25/2015  CLINICAL DATA:  Intubated patient, acute respiratory failure, seizure activity, dementia. EXAM: PORTABLE CHEST 1 VIEW COMPARISON:  Portable chest x-ray of November 24, 2015 FINDINGS: The lungs are well-expanded. Density at the left lung base has cleared. The heart and pulmonary vascularity are normal. The mediastinum is normal in width. The endotracheal tube tip lies 5.6 cm above the carina. The esophagogastric tubes proximal port lies at or just below the GE junction with the tip in the gastric body. The right-sided PICC line tip projects over the distal third of the SVC. IMPRESSION: Interval clearing of infiltrate or atelectasis at the left lung base.  No acute cardiopulmonary abnormality is demonstrated. Advancement of the nasogastric tube by 5 cm is recommended if the tube is to be retained. Electronically Signed   By: David  [place] M.D.   On: 11/25/2015 07:19   Dg Chest Port 1 View  11/24/2015  CLINICAL DATA:  Intubated patient, acute respiratory failure, encephalopathy, seizure disorder. EXAM: PORTABLE CHEST 1 VIEW COMPARISON:  Portable chest x-ray of November 23, 2015 FINDINGS: The lungs are adequately inflated. There is no focal infiltrate. Slight increased density at the left lung base persists with obscuration of the lateral costophrenic angle. The heart and pulmonary vascularity are normal. The mediastinum is normal in width. The trachea is midline. The endotracheal tube tip lies 4.1 cm above the carina. The esophagogastric tube tip and proximal port lie well below the GE junction. IMPRESSION: Fairly stable appearance of the chest. Persistent mild left lower lobe atelectasis and trace left pleural effusion. The support tubes are in reasonable position. Electronically Signed   By: David  [place] M.D.   On: 11/24/2015 07:53   STUDIES:  CT head and c-spine 01/31 > no acute process.  Antibiotics Vancomycin 02/01>>2/4 Zosyn 02/01>>  Cultures 1/31 Blood>>NTD Urine>>NTD CSF 02/02>NTD  SIGNIFICANT EVENTS  01/31-Found "humped over" at home>>ED> admitted with acute encephalopathy and new onset seizures. 02/01-Obtunded>>intubated  DISCUSSION: 82 YO male with dementia presenting with new onset seizures/non-convulsive status epilepticus  ASSESSMENT / PLAN:  PULMONARY A: Acute hypoxemic respiratory failure secondary to non-convulsive status epilepticus. Possible aspiration - no hx to support; however, pt has had dysphagia for years and is unresponsvie. New right upper lobe lung opacity-no leukocytosis P: Continue PS as tolerated but no extubation given mental status. VAP prevention. Albuterol prn. Empiric abx for aspiration PNA. Daily  CXR.    CARDIOVASCULAR A:  Hypotension H/O Hypertension P:  Hemodynamic monitoring per ICU protocol. Dopamine gtt off at this point. Hold all home antihypertensives. KVO IVF.  RENAL A:   AKI-Creatinine 1.3 Hypokalemia P:   Monitor I/O. F/U BMP. Hold diuretics. Monitor and replace electrolytes.  GASTROINTESTINAL A:   Lower GI bleeding overnight. P:   PPI for GI prophylaxis. Tube feeds per nutrition. GI consult appreciated, no procedures planned for now.  HEMATOLOGIC A:   Hg drop by 2 gms overnight. P:  Monitor CBC. Transfuse for target of 7.  INFECTIOUS A:   Low rade fever New right upper lobe lung opacity on CXR P:   Cultures negative to date. Continue empiric antibiotics, plan to stop at day 8.  ENDOCRINE A:   Hypothyroidism P:   Monitor glucose with BMP. Synthroid at home dose.  NEUROLOGIC A: New onset seizures/non-convulsive status epilepticus Acute encephalopathy 2/2 seizure activity Fall - unclear etiology-Syncope vs seizure; CT head negative and Seizure activity reported by EMS. R/O meningitis P:  Neurology following Neuro checks Keppra, vimpat, and dilantin per neurology. D/C all sedation. LP results; cultures  ntd, no meninigitis  FAMILY  - Updates: Family updated VERY MUCH at length bedside. - Inter-disciplinary family meet or Palliative Care meeting due by:  02/08.  The patient is critically ill with multiple organ systems failure and requires high complexity decision making for assessment and support, frequent evaluation and titration of therapies, application of advanced monitoring technologies and extensive interpretation of multiple databases.   Critical Care Time devoted to patient care services described in this note is  35  Minutes. This time reflects time of care of this signee Dr Koren Bound. This critical care time does not reflect procedure time, or teaching time or supervisory time of PA/NP/Med student/Med Resident etc but  could involve care discussion time.  Alyson Reedy, M.D. [facility] Pulmonary/Critical Care Medicine. Pager: [phone]. After hours pager: [phone].  11/25/2015, 10:40 AM

## 2015-11-25 NOTE — Progress Notes (Signed)
LTM taken off per Dr. Hosie Poisson, no skin breakdown seen.

## 2015-11-25 NOTE — Progress Notes (Signed)
[facility] ADULT ICU REPLACEMENT PROTOCOL FOR AM LAB REPLACEMENT ONLY  The patient does apply for the [facility] Adult ICU Electrolyte Replacment Protocol based on the criteria listed below:   1. Is GFR >/= 40 ml/min? Yes.    Patient's GFR today is >60 2. Is urine output >/= 0.5 ml/kg/hr for the last 6 hours? Yes.   Patient's UOP is 0.7 ml/kg/hr 3. Is BUN < 60 mg/dL? Yes.    Patient's BUN today is 48 4. Abnormal electrolyte(s): Potassium,3.4, Phosphate, 1.9  5. Ordered repletion with: [facility] adult ICU replacement protocol 6. If a panic level lab has been reported, has the CCM MD in charge been notified? No..   Physician:  Dr. Loletha Grayer  [facility], Alda Berthold E 11/25/2015 6:58 AMl

## 2015-11-25 NOTE — Progress Notes (Signed)
Narrative: No noted clinical seizure activity.   Keppra discontinued on 2/06.   Exam: Filed Vitals:   11/25/15 0500 11/25/15 0600  BP: 103/50 128/64  Pulse: 70 72  Temp:    Resp: 16 16   Gen: In bed, intubated Resp: Ventilated  Abd: soft, nt  Neuro:  MS: Does not open eyes, does not speak or follow commands CN: Left pupil postsurgical, right pupil reactive Motor: No movement Sensory: Does not respond  Lumbar puncture 0 WBC, mildly elevated protein  Impression: 82 year old male with partial status epilepticus associated with reduced consciousness, though he was able to follow commands prior to more aggressive therapy.  I suspect this is secondary to his dementia, as seizures are not terribly uncommon with dementia and a percentage will present with status epilepticus.   CSF was performed with 0 white cells and mildly elevated protein at 50, normal IgG index. With no signs of inflammation on CSF either with pleocytosis or elevation of the IgG index, I do not think that an inflammatory cause is very likely.  He continues to be severely obtunded, have held both dilantin and keppra with no noted improvement in mental status. With age and baseline dementia concern that he may not get back to prior baseline.   Recommendations: 1) continue Vimpat 200 twice a day. Will consider tapering off this today to fully assess.  2) stop dilantin and keppra 3) continue to monitor for now, guarded prognosis for meaningful recovery at this time   Elspeth Cho, DO [facility] [phone]  If 7pm- 7am, please page neurology on call as listed in AMION.

## 2015-11-26 ENCOUNTER — Inpatient Hospital Stay (HOSPITAL_COMMUNITY): Payer: Medicare Other

## 2015-11-26 DIAGNOSIS — Z515 Encounter for palliative care: Secondary | ICD-10-CM

## 2015-11-26 LAB — BLOOD GAS, ARTERIAL
ACID-BASE DEFICIT: 0.4 mmol/L (ref 0.0–2.0)
BICARBONATE: 23.7 meq/L (ref 20.0–24.0)
Drawn by: 44166
FIO2: 0.3
LHR: 16 {breaths}/min
MECHVT: 480 mL
O2 Saturation: 98.1 %
PEEP: 5 cmH2O
PO2 ART: 113 mmHg — AB (ref 80.0–100.0)
Patient temperature: 98.7
TCO2: 24.8 mmol/L (ref 0–100)
pCO2 arterial: 38.3 mmHg (ref 35.0–45.0)
pH, Arterial: 7.408 (ref 7.350–7.450)

## 2015-11-26 LAB — BASIC METABOLIC PANEL
ANION GAP: 6 (ref 5–15)
BUN: 41 mg/dL — ABNORMAL HIGH (ref 6–20)
CALCIUM: 8.2 mg/dL — AB (ref 8.9–10.3)
CO2: 27 mmol/L (ref 22–32)
CREATININE: 0.92 mg/dL (ref 0.61–1.24)
Chloride: 114 mmol/L — ABNORMAL HIGH (ref 101–111)
Glucose, Bld: 154 mg/dL — ABNORMAL HIGH (ref 65–99)
Potassium: 3.7 mmol/L (ref 3.5–5.1)
SODIUM: 147 mmol/L — AB (ref 135–145)

## 2015-11-26 LAB — GLUCOSE, CAPILLARY
Glucose-Capillary: 127 mg/dL — ABNORMAL HIGH (ref 65–99)
Glucose-Capillary: 136 mg/dL — ABNORMAL HIGH (ref 65–99)
Glucose-Capillary: 130 mg/dL — ABNORMAL HIGH (ref 65–99)

## 2015-11-26 LAB — MAGNESIUM: MAGNESIUM: 1.8 mg/dL (ref 1.7–2.4)

## 2015-11-26 LAB — CBC
HCT: 23.4 % — ABNORMAL LOW (ref 39.0–52.0)
HEMOGLOBIN: 7.7 g/dL — AB (ref 13.0–17.0)
MCH: 30.4 pg (ref 26.0–34.0)
MCHC: 32.9 g/dL (ref 30.0–36.0)
MCV: 92.5 fL (ref 78.0–100.0)
PLATELETS: 209 10*3/uL (ref 150–400)
RBC: 2.53 MIL/uL — AB (ref 4.22–5.81)
RDW: 15.3 % (ref 11.5–15.5)
WBC: 8.7 10*3/uL (ref 4.0–10.5)

## 2015-11-26 LAB — PHOSPHORUS: PHOSPHORUS: 2.2 mg/dL — AB (ref 2.5–4.6)

## 2015-11-26 MED ORDER — MORPHINE BOLUS VIA INFUSION
5.0000 mg | INTRAVENOUS | Status: DC | PRN
  Filled 2015-11-26: qty 20

## 2015-11-26 MED ORDER — MORPHINE SULFATE 25 MG/ML IV SOLN
10.0000 mg/h | INTRAVENOUS | Status: DC
  Administered 2015-11-28 – 2015-11-30 (×2): 5 mg/h via INTRAVENOUS
  Filled 2015-11-26 (×3): qty 10

## 2015-11-26 MED ORDER — FREE WATER
250.0000 mL | Freq: Four times a day (QID) | Status: DC
  Administered 2015-11-26: 250 mL

## 2015-11-26 MED ORDER — MORPHINE SULFATE 25 MG/ML IV SOLN
10.0000 mg/h | INTRAVENOUS | Status: DC
  Administered 2015-11-26: 5 mg/h via INTRAVENOUS
  Filled 2015-11-26: qty 10

## 2015-11-26 MED ORDER — MORPHINE BOLUS VIA INFUSION
5.0000 mg | INTRAVENOUS | Status: DC | PRN
Start: 2015-11-26 — End: 2015-12-01
  Filled 2015-11-26: qty 20

## 2015-11-26 NOTE — Procedures (Signed)
Extubation Procedure Note  Patient Details:   Name: William Dougherty DOB: [birth year] MRN: [id number]   Airway Documentation:     Evaluation  O2 sats: stable throughout Complications: No apparent complications Patient did tolerate procedure well. Bilateral Breath Sounds: Clear, Diminished Suctioning: Oral, Airway No   Patient was extubated to a 2L [place] per end of life withdrawal order. RN was at the bedside with RT. RT will continue to monitor.  [name] [name] 11/26/2015, 4:05 PM

## 2015-11-26 NOTE — Progress Notes (Signed)
   11/26/15 1325  Clinical Encounter Type  Visited With Patient not available;Health care provider  Visit Type Initial;Critical Care  Referral From Physician   Chaplain responded to end of life orders from patient's physician. Patient's family was not present at this time, and according to the patient's RN, family's pastor is well involved in supporting them. Chaplain support available as needed.   Alda Ponder, Chaplain 11/26/2015 2:30 PM

## 2015-11-26 NOTE — Progress Notes (Signed)
RT found patients tube 22 @ lip. RT advanced pt's tube to 24 @ the lip. RT will continue to monitor.

## 2015-11-26 NOTE — Progress Notes (Signed)
abg collected  

## 2015-11-26 NOTE — Progress Notes (Signed)
Narrative: No noted clinical seizure activity. Appears more awake this morning.   Exam: Filed Vitals:   11/26/15 0500 11/26/15 0600  BP: 126/63 125/59  Pulse: 71 80  Temp:    Resp: 16 17   Gen: In bed, intubated Resp: Ventilated  Abd: soft, nt  Neuro:  MS: Minimal spontaneous eye opening, no purposeful movements CN: Left pupil postsurgical, right pupil reactive Motor: appears restless, no purposeful movements Sensory: Does not respond to noxious stimuli  Lumbar puncture 0 WBC, mildly elevated protein  Impression: 82 year old male with partial status epilepticus associated with reduced consciousness, though he was able to follow commands prior to more aggressive therapy.  I suspect this is secondary to his dementia, as seizures are not terribly uncommon with dementia and a percentage will present with status epilepticus.   CSF was performed with 0 white cells and mildly elevated protein at 50, normal IgG index. With no signs of inflammation on CSF either with pleocytosis or elevation of the IgG index, I do not think that an inflammatory cause is very likely.  He continues to be severely obtunded, have held both dilantin and keppra with no noted improvement in mental status. With age and baseline dementia concern that he may not get back to prior baseline.   Recommendations: 1) continue Vimpat 200 twice a day.   2) stop dilantin and keppra 3) Hold all sedating medications as tolerated 4) continue to monitor for now   Elspeth Cho, DO [facility] [phone]  If 7pm- 7am, please page neurology on call as listed in AMION.

## 2015-11-26 NOTE — Progress Notes (Signed)
Spoke with family at their request, after discussion, decision was made that the family is ready for extubation today.  Will start morphine (then entire family but the son are ok with that) and will terminally extubate now once family is ready.  Alyson Reedy, M.D. [facility] Pulmonary/Critical Care Medicine. Pager: [phone]. After hours pager: [phone].

## 2015-11-26 NOTE — Progress Notes (Signed)
Name: [name] MRN: [id number] DOB: 1926/10/08    ADMISSION DATE:  2015/11/19   CONSULTATION DATE:  11/19/15  REFERRING MD :  EDP  CHIEF COMPLAINT:  AMS, fall  HISTORY OF PRESENT ILLNESS:  William Dougherty is a 82 y.o. male with a PMH as outlined below.  He was in his USOH on evening of 2015/11/19 and after eating dinner, he was sitting at dinner table while family was in other room.  Family then heard a loud noise and when they came to dinner table, pt was lying on the floor.  EMS was dispatched and on en route to ED, pt had seizure like activity for 1 minute for which he was given 5mg  versed.  In ED, pt was hypothermic to 96.28F.  Initial lactate was 2.98 with repeat down to 0.96.  He had mild AKI (SCr 1.29).  ABG revealed hypoxemia; otherwise normal.   Head CT was negative for acute process.  Pt was minimally responsive however; therefore, he was placed on BiPAP.  Per notes, he remained minimally responsive; therefore, PCCM was called due to concern he may fail BiPAP.  During my exam, pt is pulling at mask and attempting to get it off.  Repeat lactate is down to 0.96.  Remaining labs essentially normal with exception of mild AKI.  Per family, pt has fairly significant dementia.  He is able to walk at home using a walker, but otherwise needs assistance with ADL's.  He is able to eat, but eats pureed food and requires assistance with feeding. Despite his dementia, family is certain that if needed, they would opt for short term life support / full code status.  On 02/01 patient became obtunded after receiving keppra and versed. 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 Seen by neurology and there was concern for new onset seizures/non-convulsive status epilseptics and patient's ability to maintain airway while receiving sedating medications for seizure control. PCCM was consulted for intubation and airway management.  SUBJECTIVE:   Off pressors, completely unresponsive, no  events overnight.   VITAL SIGNS: Temp:  [97.3 F (36.3 C)-99 F (37.2 C)] 99 F (37.2 C) (02/08 0400) Pulse Rate:  [65-88] 81 (02/08 1000) Resp:  [12-30] 22 (02/08 1000) BP: (99-159)/(47-129) 117/47 mmHg (02/08 1000) SpO2:  [95 %-100 %] 100 % (02/08 1000) FiO2 (%):  [30 %] 30 % (02/08 0848) Weight:  [59.8 kg (131 lb 13.4 oz)] 59.8 kg (131 lb 13.4 oz) (02/08 0500)  PHYSICAL EXAMINATION: General: Off sedation, not responsive, does not withdraw to pain. Neuro: Pupils dilated, left irregular, sluggish, no corneal reflex. HEENT: Sclerae anicteric, moist mucus membranes. Cardiovascular: RRR, no MRG, no edema  Lungs: No wheezing, crackles.  Abdomen: Non-distended; soft, +BS Musculoskeletal: No gross deformities Skin: No rashes.  Recent Labs Lab 11/24/15 0450 11/25/15 0545 11/26/15 0420  NA 144 144 147*  K 3.5 3.4* 3.7  CL 111 109 114*  CO2 24 26 27   BUN 45* 48* 41*  CREATININE 1.12 1.03 0.92  GLUCOSE 147* 158* 154*    Recent Labs Lab 11/23/15 1642  11/24/15 2345 11/25/15 0545 11/26/15 0420  HGB 10.3*  < > 8.4* 8.1* 7.7*  HCT 30.2*  < > 25.3* 24.8* 23.4*  WBC 13.0*  --   --  9.5 8.7  PLT 191  --   --  182 209  < > = values in this interval not displayed. Dg Chest Port 1 View  11/26/2015  CLINICAL DATA:  Endotracheal tube position EXAM: PORTABLE CHEST 1  VIEW COMPARISON:  11/25/2015 FINDINGS: Endotracheal tube 2 cm above the carina. Central venous catheter tip in the right atrium unchanged. NG tube is been pulled back with the tip in the mid to distal esophagus. Mild bibasilar airspace disease has progressed in the interval and likely represents atelectasis. Negative for heart failure or edema. IMPRESSION: Endotracheal tube 2 cm above the carina NG tube in the mid to distal esophagus Central venous catheter tip in the right atrium Mild bibasilar atelectasis has developed since yesterday. Electronically Signed   By: Marlan Palau M.D.   On: 11/26/2015 07:14   Dg Chest Port 1  View  11/25/2015  CLINICAL DATA:  Intubated patient, acute respiratory failure, seizure activity, dementia. EXAM: PORTABLE CHEST 1 VIEW COMPARISON:  Portable chest x-ray of November 24, 2015 FINDINGS: The lungs are well-expanded. Density at the left lung base has cleared. The heart and pulmonary vascularity are normal. The mediastinum is normal in width. The endotracheal tube tip lies 5.6 cm above the carina. The esophagogastric tubes proximal port lies at or just below the GE junction with the tip in the gastric body. The right-sided PICC line tip projects over the distal third of the SVC. IMPRESSION: Interval clearing of infiltrate or atelectasis at the left lung base. No acute cardiopulmonary abnormality is demonstrated. Advancement of the nasogastric tube by 5 cm is recommended if the tube is to be retained. Electronically Signed   By: David  [place] M.D.   On: 11/25/2015 07:19   STUDIES:  CT head and c-spine 01/31 > no acute process.  Antibiotics Vancomycin 02/01>>2/4 Zosyn 02/01>>  Cultures 1/31 Blood>>NTD Urine>>NTD CSF 02/02>NTD  SIGNIFICANT EVENTS  01/31-Found "humped over" at home>>ED> admitted with acute encephalopathy and new onset seizures. 02/01-Obtunded>>intubated  DISCUSSION: 82 YO male with dementia presenting with new onset seizures/non-convulsive status epilepticus  ASSESSMENT / PLAN:  PULMONARY A: Acute hypoxemic respiratory failure secondary to non-convulsive status epilepticus. Possible aspiration - no hx to support; however, pt has had dysphagia for years and is unresponsvie. New right upper lobe lung opacity-no leukocytosis P: Continue PS as tolerated but no extubation given mental status. VAP prevention. Albuterol prn. Empiric abx for aspiration PNA. Daily CXR.  CARDIOVASCULAR A:  Hypotension H/O Hypertension P:  Hemodynamic monitoring per ICU protocol. Dopamine gtt off at this point. Hold all home antihypertensives. KVO IVF.  RENAL A:    AKI-Creatinine 1.3 Hypokalemia P:   Monitor I/O. F/U BMP. Hold diuretics. Monitor and replace electrolytes.  GASTROINTESTINAL A:   Lower GI bleeding overnight. P:   PPI for GI prophylaxis. Tube feeds per nutrition. GI consult appreciated, no procedures planned for now.  HEMATOLOGIC A:   Hg drop by 2 gms overnight. P:  Monitor CBC. Transfuse for target of 7.  INFECTIOUS A:   Low rade fever New right upper lobe lung opacity on CXR P:   Cultures negative to date. Continue empiric antibiotics, plan to stop at day 8.  ENDOCRINE A:   Hypothyroidism P:   Monitor glucose with BMP. Synthroid at home dose.  NEUROLOGIC A: New onset seizures/non-convulsive status epilepticus Acute encephalopathy 2/2 seizure activity Fall - unclear etiology-Syncope vs seizure; CT head negative and Seizure activity reported by EMS. R/O meningitis P:  Neurology following Neuro checks D/C Keppra and dilantin per neurology. Continue vimpat. D/C all sedation. LP results; cultures ntd, no meninigitis  FAMILY  - Updates: Family updated VERY MUCH at length bedside.  DNR agreed upon, they are trying to decide wether to withdraw today or on Friday  since his grandson birthday is [birth year]9. - Inter-disciplinary family meet or Palliative Care meeting due by:  02/08.  The patient is critically ill with multiple organ systems failure and requires high complexity decision making for assessment and support, frequent evaluation and titration of therapies, application of advanced monitoring technologies and extensive interpretation of multiple databases.   Critical Care Time devoted to patient care services described in this note is  35  Minutes. This time reflects time of care of this signee Dr Koren Bound. This critical care time does not reflect procedure time, or teaching time or supervisory time of PA/NP/Med student/Med Resident etc but could involve care discussion time.  Alyson Reedy,  M.D. [facility] Pulmonary/Critical Care Medicine. Pager: [phone]. After hours pager: [phone].  11/26/2015, 10:26 AM

## 2015-11-26 NOTE — Procedures (Signed)
Continuous Video-EEG Monitoring Report  Patient: William Dougherty, William Dougherty    EEG No.ID: [id number]    DOB: 1926/03/16 Age: 82  MED REC NO: [id number] Gender: Male  TECH: Marianne Sofia    Physician: Ronnie Doss    Referring Physician: Ritta Slot Time: 7:3:00 AM  Report Date: 11/24/2015 Recorded Time: 1439 min.   tudy Duration:11/23/2015 07:30 to 11/24/2015 07:30 CPT [id number] Diagnosis:R56.9  History: This is an 82 year old male presenting with seizures without returning to mental status baseline. Video-EEG monitoring was performed to evaluate for seizures and guide treatment.  Technical Details: Long-term video-EEG monitoring was performed using standard setting per the guidelines. Briefly, a minimum of 21 electrodes were placed on scalp according to the International 10-20 or/and 10-10 Systems. Supplemental electrodes were placed as needed. Single EKG electrode was also used to detect cardiac arrhythmia. Patient's behavior was continuously recorded on video simultaneously with EEG. A minimum of 16 channels were used for data display. Each epoch of study was reviewed manually daily and as needed using standard referential and bipolar montages.   EEG Description: There was generalized polymorphic delta slowing.  Diffuse beta activity was also seen, which could be due to medication effect (e.g., benzodiazepine, propofol) . No posterior dominant rhythm or sleep architecture was present. No epileptiform discharges or seizures were in evidence.                                                Impression: This is an abnormal EEG due to the presence of generalized polymorphic delta slowing, suggesting severe encephalopathy, but medication effect (e.g., benzodiazepine, propofol) could not be excluded. .    Reading Physician: Ronnie Doss, MD, PhD

## 2015-11-26 NOTE — Procedures (Signed)
Continuous Video-EEG Monitoring Report  Patient: William Dougherty, William Dougherty    EEG No.ID: [id number]    DOB: 05-Dec-1925 Age: 82  MED REC NO: [id number] Gender: Male  TECH: Marianne Sofia    Physician: Ronnie Doss    Referring Physician: Ritta Slot Time: 07:30 AM  Report Date: 11/25/2015 Recorded Time: 1439 min.   tudy Duration:11/24/2015 07:30 to 11/25/2015 07:30 CPT [id number] Diagnosis:R56.9  History: This is an 82 year old male presenting with seizures without returning to mental status baseline. Video-EEG monitoring was performed to evaluate for seizures and guide treatment.  Technical Details: Long-term video-EEG monitoring was performed using standard setting per the guidelines. Briefly, a minimum of 21 electrodes were placed on scalp according to the International 10-20 or/and 10-10 Systems. Supplemental electrodes were placed as needed. Single EKG electrode was also used to detect cardiac arrhythmia. Patient's behavior was continuously recorded on video simultaneously with EEG. A minimum of 16 channels were used for data display. Each epoch of study was reviewed manually daily and as needed using standard referential and bipolar montages.   EEG Description: There was generalized polymorphic delta slowing. Diffuse beta activity was also seen, which could be due to medication effect (e.g., benzodiazepine, propofol) . No posterior dominant rhythm or sleep architecture was present. Intermittent focal sharp waves were present in the right frontotemporal region (F4, F8).  No seizures were in evidence.   Impression: This is an abnormal EEG due to the presence of the following: 1) Generalized polymorphic delta slowing, suggesting severe encephalopathy, but medication effect (e.g., benzodiazepine, propofol) could not be excluded; 2) Interictal epileptiform discharges in the right  frontotemporal region.            .   .    Reading Physician: Ronnie Doss, MD, PhD

## 2015-11-26 NOTE — Procedures (Signed)
Continuous Video-EEG Monitoring Report  Patient: William Dougherty, William Dougherty    EEG No.ID: [id number]    DOB: 1926/10/08 Age: 82  MED REC NO: [id number] Gender: Male  TECH: Marianne Sofia    Physician: Ronnie Doss    Referring Physician: Ritta Slot Time: 07:30 AM  Report Date: 11/25/2015 Recorded Time: 72 min.   Study Duration:11/25/2015 07:30 to 11/25/2015 08:42 CPT [id number] Diagnosis:R56.9  History: This is an 82 year old male presenting with seizures without returning to mental status baseline. Video-EEG monitoring was performed to evaluate for seizures and guide treatment.  Technical Details: Long-term video-EEG monitoring was performed using standard setting per the guidelines. Briefly, a minimum of 21 electrodes were placed on scalp according to the International 10-20 or/and 10-10 Systems. Supplemental electrodes were placed as needed. Single EKG electrode was also used to detect cardiac arrhythmia. Patient's behavior was continuously recorded on video simultaneously with EEG. A minimum of 16 channels were used for data display. Each epoch of study was reviewed manually daily and as needed using standard referential and bipolar montages.   EEG Description: There was generalized polymorphic delta slowing. Diffuse beta activity was also seen, which could be due to medication effect (e.g., benzodiazepine, propofol) . No posterior dominant rhythm or sleep architecture was present. Intermittent focal sharp waves were present in the right frontotemporal region (F4, F8). No seizures were in evidence.   Impression: This is an abnormal EEG due to the presence of the following: 1) Generalized polymorphic delta slowing, suggesting severe encephalopathy, but medication effect (e.g., benzodiazepine, propofol) could not be excluded; 2) Interictal  epileptiform discharges in the right frontotemporal region. .  .    Reading Physician: Ronnie Doss, MD, PhD

## 2015-11-26 NOTE — Progress Notes (Signed)
eLink Physician-Brief Progress Note Patient Name: William Dougherty DOB: 06-25-1926 MRN: [id number]   Date of Service  11/26/2015  HPI/Events of Note  Camera conversation with patient's family at bedside. They report he is responding more blinking to threat and opening eyes to voice. Patient on full comfort care. Spent a significant amount of time educating the family regarding the role of IV morphine and reassuring them that we will not let him suffer.  eICU Interventions  1. Short hiatus of IV Morphine to assess need. 2. Family to notify nurse if patient shows signs of distress/discomfort to restart Morphine 3. Patient to remain in ICU & full comfort care 4. Neuro Checks q2hr     Intervention Category Major Interventions: Other:  Lawanda Cousins 11/26/2015, 8:38 PM

## 2015-11-27 DIAGNOSIS — D649 Anemia, unspecified: Secondary | ICD-10-CM

## 2015-11-27 DIAGNOSIS — R1310 Dysphagia, unspecified: Secondary | ICD-10-CM

## 2015-11-27 DIAGNOSIS — K922 Gastrointestinal hemorrhage, unspecified: Secondary | ICD-10-CM

## 2015-11-27 MED ORDER — ATROPINE SULFATE 1 % OP SOLN
4.0000 [drp] | OPHTHALMIC | Status: DC | PRN
  Filled 2015-11-27: qty 2

## 2015-11-27 MED ORDER — ACETAMINOPHEN 650 MG RE SUPP
650.0000 mg | RECTAL | Status: DC | PRN
  Administered 2015-11-28: 650 mg via RECTAL
  Filled 2015-11-27: qty 1

## 2015-11-27 MED ORDER — SODIUM CHLORIDE 0.9% FLUSH: 10.0000 mL | INTRAVENOUS | Status: DC | PRN

## 2015-11-27 MED ORDER — LORAZEPAM 2 MG/ML IJ SOLN
1.0000 mg | INTRAMUSCULAR | Status: DC | PRN
Start: 2015-11-27 — End: 2015-12-01

## 2015-11-27 NOTE — Progress Notes (Signed)
I met with pt's family.  Explained that intermittent eye opening does not indicate he is having meaningful neuro recovery.  I explained that he is in the dying process, and discussed what to expect.  We are in agreement to continue with comfort measures, and will transfer to med-surg unit.  Chesley Mires, MD [facility] Pulmonary/Critical Care 11/27/2015, 11:18 AM Pager:  [phone] After 3pm call: [phone]

## 2015-11-27 NOTE — Care Management Important Message (Signed)
Important Message  Patient Details  Name: Keiondre Colee MRN: [id number] Date of Birth: 11-07-1925   Medicare Important Message Given:  Yes    Tandy Grawe, Stephan Minister 11/27/2015, 8:25 AM

## 2015-11-27 NOTE — Progress Notes (Signed)
Pt just arrived from 19M to 6n11. Children at bedside. Son is extremely anxious at this time.

## 2015-11-27 NOTE — Progress Notes (Signed)
Nutrition Brief Note  Chart reviewed. Pt now transitioning to comfort care.  No further nutrition interventions warranted at this time.  Please re-consult as needed.   Alanta Scobey RD, LDN, CNSC [phone] Pager [phone] After Hours Pager    

## 2015-11-27 NOTE — Progress Notes (Signed)
PCCM PROGRESS NOTE  Subjective: On morphine gtt.  Vital signs: BP 134/70 mmHg  Pulse 106  Temp(Src) 98.9 F (37.2 C) (Oral)  Resp 14  Ht  (1.626 m)  Wt 131 lb 13.4 oz (59.8 kg)  BMI 22.62 kg/m2  SpO2 89%  General: ill appearing Neuro: opens eyes with brisk stimulation, does not follow commands Cardiac: regular Chest: b/l rhonchi Abd: soft, non tender Ext: 1+ edema Skin: no rashes  CMP Latest Ref Rng 11/26/2015 11/25/2015 11/24/2015  Glucose 65 - 99 mg/dL 960(A) 540(J) 811(B)  BUN 6 - 20 mg/dL 14(N) 82(N) 56(O)  Creatinine 0.61 - 1.24 mg/dL 1.30 8.65 7.84  Sodium 135 - 145 mmol/L 147(H) 144 144  Potassium 3.5 - 5.1 mmol/L 3.7 3.4(L) 3.5  Chloride 101 - 111 mmol/L 114(H) 109 111  CO2 22 - 32 mmol/L Calcium 8.9 - 10.3 mg/dL 8.2(L) 8.3(L) 8.2(L)    CBC Latest Ref Rng 11/26/2015 11/25/2015 11/24/2015  WBC 4.0 - 10.5 K/uL 8.7 9.5 -  Hemoglobin 13.0 - 17.0 g/dL 7.7(L) 8.1(L) 8.4(L)  Hematocrit 39.0 - 52.0 % 23.4(L) 24.8(L) 25.3(L)  Platelets 150 - 400 K/uL 209 182 -    Discussion: 82 yo male with syncopal event at home associated with AKI, hypothermia, lactic acidosis.  He has hx of advanced dementia.  He was found to have status epilepticus.  He has not had improvement with AED's.  Extubated 2/08 with plan to transition to comfort care.  Assessment: Acute encephalopathy 2nd to seizures, dementia. Acute respiratory failure >> extubated 2/08. Dysphagia. AKI. Hypotension 2nd to hypovolemia. Hypokalemia. Lower GI bleed. Acute blood loss anemia. Fever. Hx of hypothyroidism.  Plan: DNR/DNI Continue morphine gtt Will need to clarify with family that eye opening with stimulation is not necessarily indication of recovery  Coralyn Helling, MD [facility] Pulmonary/Critical Care 11/27/2015, 7:37 AM Pager:  [phone] After 3pm call: [phone]

## 2015-11-28 DIAGNOSIS — Z515 Encounter for palliative care: Secondary | ICD-10-CM

## 2015-11-28 DIAGNOSIS — Z66 Do not resuscitate: Secondary | ICD-10-CM

## 2015-11-28 DIAGNOSIS — F0391 Unspecified dementia with behavioral disturbance: Secondary | ICD-10-CM

## 2015-11-28 MED ORDER — POLYVINYL ALCOHOL 1.4 % OP SOLN
1.0000 [drp] | Freq: Four times a day (QID) | OPHTHALMIC | Status: DC | PRN
  Filled 2015-11-28: qty 15

## 2015-11-28 MED ORDER — HALOPERIDOL LACTATE 2 MG/ML PO CONC
0.5000 mg | ORAL | Status: DC | PRN
  Filled 2015-11-28: qty 0.3

## 2015-11-28 MED ORDER — ONDANSETRON 4 MG PO TBDP: 4.0000 mg | ORAL_TABLET | Freq: Four times a day (QID) | ORAL | Status: DC | PRN

## 2015-11-28 MED ORDER — ACETAMINOPHEN 650 MG RE SUPP: 650.0000 mg | Freq: Four times a day (QID) | RECTAL | Status: DC | PRN

## 2015-11-28 MED ORDER — WHITE PETROLATUM GEL
Status: AC
  Administered 2015-11-28: 09:00:00
  Filled 2015-11-28: qty 1

## 2015-11-28 MED ORDER — DIAZEPAM 5 MG/ML IJ SOLN
5.0000 mg | Freq: Two times a day (BID) | INTRAMUSCULAR | Status: DC
  Administered 2015-11-28 – 2015-11-30 (×4): 5 mg via INTRAVENOUS
  Filled 2015-11-28 (×4): qty 2

## 2015-11-28 MED ORDER — HALOPERIDOL LACTATE 5 MG/ML IJ SOLN: 2.0000 mg | Freq: Four times a day (QID) | INTRAMUSCULAR | Status: DC

## 2015-11-28 MED ORDER — HALOPERIDOL LACTATE 5 MG/ML IJ SOLN: 0.5000 mg | INTRAMUSCULAR | Status: DC | PRN

## 2015-11-28 MED ORDER — ONDANSETRON HCL 4 MG/2ML IJ SOLN
4.0000 mg | Freq: Four times a day (QID) | INTRAMUSCULAR | Status: DC | PRN
Start: 2015-11-28 — End: 2015-12-01

## 2015-11-28 MED ORDER — GLYCOPYRROLATE 1 MG PO TABS
1.0000 mg | ORAL_TABLET | ORAL | Status: DC | PRN
  Filled 2015-11-28: qty 1

## 2015-11-28 MED ORDER — ACETAMINOPHEN 325 MG PO TABS: 650.0000 mg | ORAL_TABLET | Freq: Four times a day (QID) | ORAL | Status: DC | PRN

## 2015-11-28 MED ORDER — DIAZEPAM 5 MG/ML IJ SOLN: 5.0000 mg | Freq: Three times a day (TID) | INTRAMUSCULAR | Status: DC

## 2015-11-28 MED ORDER — BIOTENE DRY MOUTH MT LIQD: 15.0000 mL | OROMUCOSAL | Status: DC | PRN

## 2015-11-28 MED ORDER — SODIUM CHLORIDE 0.9 % IV SOLN
500.0000 mg | Freq: Two times a day (BID) | INTRAVENOUS | Status: DC
  Administered 2015-11-28 – 2015-11-30 (×5): 500 mg via INTRAVENOUS
  Filled 2015-11-28 (×6): qty 5

## 2015-11-28 MED ORDER — GLYCOPYRROLATE 0.2 MG/ML IJ SOLN: 0.2000 mg | INTRAMUSCULAR | Status: DC | PRN

## 2015-11-28 MED ORDER — HALOPERIDOL 1 MG PO TABS: 0.5000 mg | ORAL_TABLET | ORAL | Status: DC | PRN

## 2015-11-28 NOTE — Consult Note (Signed)
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 He was admitted to the hospital on 11/16/2015 after being found down at home- the family saw witnessed seizure activity and called EMS for which he was stabilized by paramedics and subsequently brought to the emergency department intubated and has had a 10 day ICU stay with minimal improvement in his mental status or condition. MRI of his brain shows profound atrophy, multiple punctate areas of subdural hematoma and a significant right sided basal ganglia subacute chronic stroke. He had status epilepticus requiring multiple drugs to control seizure throughout this hospitalization and at the time he was extubated a decision was made to move towards comfort care on 2/8.   Of note the palliative team was consult it by the ED physician on January 31 of 2017 but this order was subsequently canceled before our team could assist this family. We have now been asked to see the patient following a transition to comfort care that happened on 2/8 with a request for hospice services evaluation/GIP admission.  I met with 2 grandsons and also with one son and spoke with daughter-in-law on the phone there are multiple family members involved in decision making they tell me they have not had a comprehensive family goals of care meeting therefore information is not being  universally shared and there is confusion about which direction they need to be taking currently as well as what his prognosis is. They have varying levels of understanding among the family.  Assessment of their understanding of his condition on my initial introduction revealed fragented understanding of how serious his condition was in general. I went through William Dougherty condition in detail with the family members who are present in the room including showing them the MRI images of the severe brain atrophy and abnormalities involving the basal ganglia and subdural bleeding areas- in addition while was in the room, I witnessed focal/partial seizure activity predominantly on William Dougherty left side- per family this started after extubation and has increased in intensity and frequency. His AEDs were stopped with comfort care transition and extubation.  Family requesting meeting with Palliative provider over the weekend when they can all be available.  SUMMARY OF RECOMMENDATIONS  Before admitting to GIP or discussing residential hospice we need to make sure we have clarified the goals of care- family did not seem to be endorsing comfort care completely based on my discussion with the this afternoon- they are still wanting sedation with held so he can "wake up" yet the also "do not want him to suffer". I was explicit that he was at EOL- and on some level they accept and understand that but there is fragmented information between family members- I had removed oxygen and then the daughter in law called and insisted this be put back on.  We need to get consensus on goals.  Additionally we need to control his focal seizures-he is high risk for generalized seizure and therefore he will need AED continued. Restarted  Keppra 500 BID and scheduled q12 diazepam for his comfort.  He is appropriate for a hospice level care for EOL-hospice facility for seizure management or even home (not sure if this has been discussed  or offered)  Provided we can align family goals.  Continue Morphine infusion as prescribed.  Palliative prophylaxis ordered.   Code Status/Advance Care Planning: DNR    Code Status Orders        Start     Ordered   11/28/15 1635  Do not attempt resuscitation (DNR)   Continuous    Question Answer Comment  In the event of cardiac or respiratory ARREST Do not call a "code blue"   In the event of cardiac or respiratory ARREST Do not perform Intubation, CPR, defibrillation or ACLS   In the event of cardiac or respiratory ARREST Use medication by any route, position, wound care, and other measures to relive pain and suffering. May use oxygen, suction and manual treatment of airway obstruction as needed for comfort.      11/28/15 1636    Code Status History    Date Active Date Inactive Code Status Order ID Comments User Context   11/28/2015  4:36 PM  DNR [id number]  Acquanetta Chain, DO Inpatient   11/26/2015  8:22 PM 11/28/2015  4:36 PM DNR [id number]  Javier Glazier, MD Inpatient   11/26/2015 12:29 PM 11/26/2015  8:22 PM DNR [id number]  Rush Farmer, MD Inpatient   11/26/2015 10:50 AM 11/26/2015 12:29 PM DNR [id number]  Rush Farmer, MD Inpatient   11/19/2015  3:02 AM 11/26/2015 10:50 AM Full Code [id number]  Rise Patience, MD Inpatient      Other Directives:None  Symptom Management:   As above  Palliative Prophylaxis:   Aspiration, Frequent Pain Assessment, Oral Care and Turn Reposition  Additional Recommendations (Limitations, Scope, Preferences):  Full Comfort Care and Minimize Medications  Need Goals of Care  Psycho-social/Spiritual:  Support System: Strong Desire for further Chaplaincy support:no Additional Recommendations: Caregiving  Support/Resources and Education on Hospice  Prognosis: Hours - Days  Discharge Planning:Depends on goals:Hospital Death/ Hospice facility/ Home with Hospice/GIP --- all need to be discussed as possible options    Chief Complaint/  Primary Diagnoses: Present on Admission:  . Acute respiratory failure with hypercapnia ([place]) . Dementia . Essential hypertension . Hypothyroidism  I have reviewed the medical record, interviewed the patient and family, and examined the patient. The following aspects are pertinent.  Past Medical History  Diagnosis Date  . Hypertension   . Hypothyroidism   . BPH (benign prostatic hyperplasia)   . Glaucoma   . GERD (gastroesophageal reflux disease)   . Knee pain, bilateral   . Bipolar 1 disorder ([place])   . Hyperpigmentation     left maxilla  . Dementia    Social History   Social History  . Marital Status: Married    Spouse Name: Laxmiben  . Number of Children: 3  . Years of Education: college   Occupational History  .      retired   Social History Main Topics  . Smoking status: Former Smoker    Quit date: 08/10/1993  . Smokeless tobacco: Never Used  . Alcohol Use: No  . Drug Use: No  . Sexual Activity: Not Asked   Other Topics Concern  . None   Social History Narrative   Patient is married. (Laxmiben)   Patient lives with  Son Merrie Roof and his wife Lilyan Punt)   Patient is  a retired Chief Financial Officer.   Patient has 3 adult children.   Right handed.   Tea with milk.            Family History  Problem Relation Age of Onset  . Hypothyroidism Son   . Vitiligo Son   . Hashimoto's thyroiditis Grandchild    Scheduled Meds: . diazepam  5 mg Intravenous Q12H  . levETIRAcetam  500 mg Intravenous Q12H   Continuous Infusions: . morphine 5 mg/hr (11/28/15 1354)   PRN Meds:.acetaminophen **OR** acetaminophen, antiseptic oral rinse, atropine, glycopyrrolate **OR** glycopyrrolate **OR** glycopyrrolate, haloperidol **OR** haloperidol **OR** haloperidol lactate, LORazepam, morphine, ondansetron **OR** ondansetron (ZOFRAN) IV, polyvinyl alcohol, sodium chloride flush Medications Prior to Admission:  Prior to Admission medications   Medication Sig Start Date End Date Taking?  Authorizing Provider  levothyroxine (SYNTHROID, LEVOTHROID) 50 MCG tablet Take 50 mcg by mouth daily before breakfast.   Yes Historical Provider, MD  losartan-hydrochlorothiazide (HYZAAR) 50-12.5 MG per tablet Take 1 tablet by mouth every morning.   Yes Historical Provider, MD  TRAVATAN Z 0.004 % SOLN ophthalmic solution Place 1 drop into both eyes at bedtime. 10/31/15  Yes Historical Provider, MD  acetaminophen (TYLENOL) 500 MG tablet Take 2 tablets (1,000 mg total) by mouth every 6 (six) hours as needed. Patient not taking: Reported on 11/09/2015 12/29/14   Charlesetta Shanks, MD  hydrOXYzine (ATARAX/VISTARIL) 10 MG tablet Take 1 tablet (10 mg total) by mouth at bedtime. Patient not taking: Reported on 11/01/2015 01/13/15   Britt Bottom, MD   Allergies  Allergen Reactions  . Eggs Or Egg-Derived Products Diarrhea and Nausea Only    Review of Systems  Physical Exam  Vital Signs: BP 116/59 mmHg  Pulse 94  Temp(Src) 98.3 F (36.8 C) (Axillary)  Resp 17  Ht _0  (1.626 m)  Wt 59.8 kg (131 lb 13.4 oz)  BMI 22.62 kg/m2  SpO2 95%  SpO2: SpO2: 95 % O2 Device:SpO2: 95 % O2 Flow Rate: .O2 Flow Rate (L/min): 2 L/min  IO: Intake/output summary:  Intake/Output Summary (Last 24 hours) at 11/28/15 1651 Last data filed at 11/28/15 1245  Gross per 24 hour  Intake     50 ml  Output    700 ml  Net   -650 ml    LBM: Last BM Date: 11/27/15 Baseline Weight: Weight: 52.2 kg (115 lb 1.3 oz) Most recent weight: Weight: 59.8 kg (131 lb 13.4 oz)      Palliative Assessment/Data:  Flowsheet Rows        Most Recent Value   Intake Tab    Referral Department  -- [ED]   Unit at Time of Referral  ER   Palliative Care Primary Diagnosis  Neurology   Date Notified  11/28/15   Palliative Care Type  New Palliative care   Reason for referral  Clarify Goals of Care, End of Life Care Assistance   Date of Admission  10/23/2015   Date first seen by Palliative Care  11/28/15   # of days Palliative referral  response time  0 Day(s)   # of days IP prior to Palliative referral  0   Clinical Assessment    Palliative Performance Scale Score  10%   Pain Max last 24 hours  Not able to report   Pain Min Last 24 hours  Not able to report   Dyspnea Max Last 24 Hours  Not able to report   Dyspnea Min Last 24 hours  Not able to report  Nausea Max Last 24 Hours  Not able to report   Nausea Min Last 24 Hours  Not able to report   Anxiety Max Last 24 Hours  Not able to report   Anxiety Min Last 24 Hours  Not able to report   Other Max Last 24 Hours  Not able to report   Psychosocial & Spiritual Assessment    Palliative Care Outcomes       Additional Data Reviewed:  CBC:    Component Value Date/Time   WBC 8.7 11/26/2015 0420   HGB 7.7* 11/26/2015 0420   HCT 23.4* 11/26/2015 0420   PLT 209 11/26/2015 0420   MCV 92.5 11/26/2015 0420   NEUTROABS 11.2* 11/23/2015 1642   LYMPHSABS 0.9 11/23/2015 1642   MONOABS 0.9 11/23/2015 1642   EOSABS 0.0 11/23/2015 1642   BASOSABS 0.0 11/23/2015 1642   Comprehensive Metabolic Panel:    Component Value Date/Time   NA 147* 11/26/2015 0420   K 3.7 11/26/2015 0420   CL 114* 11/26/2015 0420   CO2 27 11/26/2015 0420   BUN 41* 11/26/2015 0420   CREATININE 0.92 11/26/2015 0420   GLUCOSE 154* 11/26/2015 0420   CALCIUM 8.2* 11/26/2015 0420   AST 30 11/17/2015 1906   ALT 14* 10/20/2015 1906   ALKPHOS 52 10/26/2015 1906   BILITOT 0.8 11/02/2015 1906   PROT 6.5 10/31/2015 1906   ALBUMIN 2.0* 11/24/2015 0450     Time In: 3:30  Time Out:5:30 Time Total: 120 Greater than 50%  of this time was spent counseling and coordinating care related to the above assessment and plan.  Signed by: Roma Schanz, DO  11/28/2015, 4:51 PM  Please contact Palliative Medicine Team phone at [phone] for questions and concerns.

## 2015-11-28 NOTE — Progress Notes (Signed)
PCCM PROGRESS NOTE  Subjective: On morphine gtt without purposeful movement. Appears comfortable.  Vital signs: BP 116/59 mmHg  Pulse 94  Temp(Src) 100.1 F (37.8 C) (Axillary)  Resp 17  Ht  (1.626 m)  Wt 131 lb 13.4 oz (59.8 kg)  BMI 22.62 kg/m2  SpO2 95% General: ill appearing Neuro: opens eyes with brisk stimulation, does not follow commands Cardiac: regular Chest: clear Abd: soft, non tender Ext: 1+ edema Skin: no rashes  CMP Latest Ref Rng 11/26/2015 11/25/2015 11/24/2015  Glucose 65 - 99 mg/dL 784(O) 962(X) 528(U)  BUN 6 - 20 mg/dL 13(K) 44(W) 10(U)  Creatinine 0.61 - 1.24 mg/dL 7.25 3.66 4.40  Sodium 135 - 145 mmol/L 147(H) 144 144  Potassium 3.5 - 5.1 mmol/L 3.7 3.4(L) 3.5  Chloride 101 - 111 mmol/L 114(H) 109 111  CO2 22 - 32 mmol/L Calcium 8.9 - 10.3 mg/dL 8.2(L) 8.3(L) 8.2(L)    CBC Latest Ref Rng 11/26/2015 11/25/2015 11/24/2015  WBC 4.0 - 10.5 K/uL 8.7 9.5 -  Hemoglobin 13.0 - 17.0 g/dL 7.7(L) 8.1(L) 8.4(L)  Hematocrit 39.0 - 52.0 % 23.4(L) 24.8(L) 25.3(L)  Platelets 150 - 400 K/uL 209 182 -    Discussion: 82 yo male with syncopal event at home associated with AKI, hypothermia, lactic acidosis.  He has hx of advanced dementia.  He was found to have status epilepticus.  He has not had improvement with AED's.  Extubated 2/08 with plan to transition to comfort care.  Assessment: Acute encephalopathy 2nd to seizures, dementia Acute respiratory failure >> extubated 2/08. Dysphagia. AKI. Hypotension 2nd to hypovolemia. Hypokalemia. Lower GI bleed. Acute blood loss anemia. Fever. Hx of hypothyroidism.  Plan: DNR/DNI Continue morphine gtt Will consult social work for inpatient hospice  Griffin Basil, MD  Internal Medicine PGY-2  [place] Pulmonary/Critical Care 11/28/2015, 11:01 AM Pager:  [phone]

## 2015-11-28 NOTE — Progress Notes (Signed)
CSW received consult regarding comfort care. CSW awaiting palliative evaluation. CSW will continue to follow and provide support while in the hospital.   Fernande Boyden, [facility] Clinical Social Worker [facility] Ph: [phone]

## 2015-11-29 DIAGNOSIS — F0390 Unspecified dementia without behavioral disturbance: Secondary | ICD-10-CM

## 2015-11-29 DIAGNOSIS — G40209 Localization-related (focal) (partial) symptomatic epilepsy and epileptic syndromes with complex partial seizures, not intractable, without status epilepticus: Secondary | ICD-10-CM

## 2015-11-29 NOTE — Progress Notes (Signed)
PCCM PROGRESS NOTE  Subjective: On morphine gtt. Family in room deny new needs or concerns. They had met with Dr Hilma Favors Palliative Care yesterday. Vital signs: BP 117/69 mmHg  Pulse 94  Temp(Src) 98 F (36.7 C) (Axillary)  Resp 18  Ht _0  (1.626 m)  Wt 59.8 kg (131 lb 13.4 oz)  BMI 22.62 kg/m2  SpO2 98%   Objective: General: ill appearing Neuro: opens eyes with brisk stimulation, does not follow commands Cardiac: regular Chest: minimal anterior rhonchi, unlabored, regular  Abd: soft, non tender Ext: 1+ edema Skin: no rashes  CMP Latest Ref Rng 11/26/2015 11/25/2015 11/24/2015  Glucose 65 - 99 mg/dL 154(H) 158(H) 147(H)  BUN 6 - 20 mg/dL 41(H) 48(H) 45(H)  Creatinine 0.61 - 1.24 mg/dL 0.92 1.03 1.12  Sodium 135 - 145 mmol/L 147(H) 144 144  Potassium 3.5 - 5.1 mmol/L 3.7 3.4(L) 3.5  Chloride 101 - 111 mmol/L 114(H) 109 111  CO2 22 - 32 mmol/L _1 Calcium 8.9 - 10.3 mg/dL 8.2(L) 8.3(L) 8.2(L)    CBC Latest Ref Rng 11/26/2015 11/25/2015 11/24/2015  WBC 4.0 - 10.5 K/uL 8.7 9.5 -  Hemoglobin 13.0 - 17.0 g/dL 7.7(L) 8.1(L) 8.4(L)  Hematocrit 39.0 - 52.0 % 23.4(L) 24.8(L) 25.3(L)  Platelets 150 - 400 K/uL 209 182 -    Discussion: 82 yo male with syncopal event at home associated with AKI, hypothermia, lactic acidosis.  He has hx of advanced dementia.  He was found to have status epilepticus.  He has not had improvement with AED's.  Extubated 2/08 with plan to transition to comfort care. Dr Delanna Ahmadi Palliative Care note reviewed and appreciated. Family in room did not offer questions or concerns today.  Assessment: Acute on chronic encephalopathy with atrophy, seizures, dementia. Acute respiratory failure >> extubated 2/08. Dysphagia. AKI. Hypotension 2nd to hypovolemia. Hypokalemia. Lower GI bleed. Acute blood loss anemia. Fever. Hx of hypothyroidism.  Plan: DNR/DNI Continue morphine gtt   CD Annamaria Boots, MD [place] 11/29/2015, 8:05 AM Pager:   [phone] After 3pm call: [phone]

## 2015-12-01 ENCOUNTER — Telehealth: Payer: Self-pay

## 2015-12-01 NOTE — Telephone Encounter (Signed)
On 12/01/2015 I received a death certificate from [facility] and [facility] (original). The death certificate is for cremation. The patient is a patient of Doctor Young. The death certificate will be taken to Pulmonary Unit tomorrow am for signature. On December 27, 2015 I received the death certificate back from Doctor Young. I got the death certificate ready and called the funeral home to let them know it was ready for pickup.

## 2015-12-17 ENCOUNTER — Ambulatory Visit: Payer: Medicare Other | Admitting: Podiatry

## 2015-12-17 NOTE — Discharge Summary (Signed)
Death Summary   HPI: William Dougherty is a 82 y.o. male history of dementia, hypertension and hypothyroidism was brought to the ER after patient's family noticed that patient had previous episode that patient slumped to the floor while having dinner. Following which patient was found to be in a confused and dazed state. This lasted for most 10 minutes. Patient did not have a tongue bite and patient wears diaper so it is not known if he had incontinence of urine. EMS was called and brought to the ER. It was noted the patient probably had a tonic-clonic seizure and was given midazolam. In the ER patient is found to be encephalopathic and was placed on BiPAP. ABG showed hypercarbic respiratory failure and this improved after time and BiPAP was discontinued. Patient on arrival also was hypothermic following which patient became febrile. Patient was started on empiric antibiotics by critical care for possible sepsis. Blood cultures were obtained. On initial exam patient had become more alert and awake but has periods where patient has shaking of his head.   Hospital course: EEG- partial non-convulsive status epilepticus MR Brain- severe global atrophy, small bilateral chronic subdural hematomas Neurology/ Dr Hosie Poisson- severe obtundation not improved by holding dilantin/ kepra. Status epilepticus attributed to dementia. Changed to Vimpat. LP done- inflammation unlikely Acute respiratory failure improved after BIPAP, then intubated for airway protection and maintenance Acute renal failure Initial hypothermia then fever- vanc/zosyn New RUL infiltrate- empiric antibiotics Palliative Care consult. DNR, comfort care Without pulse or resp. Pronounced by nurse per protocol 12/09/2015 1934 PM. COD attributed to encephalopathy with status epilepticus.  Subjective: 12/01/2015 On morphine gtt. Discussed w family- many questions. Wanted to know if bathing would make him worse, potential to wake up if valium were  stopped..  Vital signs: BP 99/50 mmHg  Pulse 88  Temp(Src) 98.5 F (36.9 C) (Oral)  Resp 19  Ht  (1.626 m)  Wt 131 lb 13.4 oz (59.8 kg)  BMI 22.62 kg/m2  SpO2 85%   Objective: General: ill appearing Neuro: unresponsive to light touch, breathing w/o other spontaneous movement Cardiac: regular Chest: quiet, unlabored, regular  Abd: soft, non tender Ext: 1+ edema Skin: no rashes  CMP Latest Ref Rng 11/26/2015 11/25/2015 11/24/2015  Glucose 65 - 99 mg/dL 829(F) 621(H) 086(V)  BUN 6 - 20 mg/dL 78(I) 69(G) 29(B)  Creatinine 0.61 - 1.24 mg/dL 2.84 1.32 4.40  Sodium 135 - 145 mmol/L 147(H) 144 144  Potassium 3.5 - 5.1 mmol/L 3.7 3.4(L) 3.5  Chloride 101 - 111 mmol/L 114(H) 109 111  CO2 22 - 32 mmol/L Calcium 8.9 - 10.3 mg/dL 8.2(L) 8.3(L) 8.2(L)    CBC Latest Ref Rng 11/26/2015 11/25/2015 11/24/2015  WBC 4.0 - 10.5 K/uL 8.7 9.5 -  Hemoglobin 13.0 - 17.0 g/dL 7.7(L) 8.1(L) 8.4(L)  Hematocrit 39.0 - 52.0 % 23.4(L) 24.8(L) 25.3(L)  Platelets 150 - 400 K/uL 209 182 -    Discussion: 82 yo male with syncopal event at home associated with AKI, hypothermia, lactic acidosis.  He has hx of advanced dementia.  He was found to have status epilepticus.  He has not had improvement with AED's.  Extubated 2/08 with plan to transition to comfort care. Dr Lamar Blinks Palliative Care note reviewed and appreciated. Family in room had many questions about prognosis and treatment options, answered to best of my ability. They still hope that he can wake up and be as before.  Assessment: Acute on chronic encephalopathy with atrophy, seizures, dementia. Acute  respiratory failure >> extubated 2/08. Dysphagia. AKI. Hypotension 2nd to hypovolemia. Hypokalemia. Lower GI bleed. Acute blood loss anemia. Fever. Hx of hypothyroidism.     CD Maple Hudson, MD [facility] Pulmonary/Critical Care 12/01/2015, 8:32 PM Pager:  [phone] After 3pm call: [phone]

## 2015-12-17 NOTE — Progress Notes (Signed)
@  1934 called family at bedside, noted pt breathless,pulseless and unresponsive. Pt is DNR. Called another nurse Yvone Neu to verify and pronounce death . Called on call Dr Chales Abrahams, family at bedside and [place] [facility] and not candidate for organ donor. Family declined autopsy. Morphine 250cc drip wasted with witnessed, Prescott Par[place]. All Personal belongings given to family. Family has not decided regarding funeral home. Pt body will transfer to morgue.

## 2015-12-17 NOTE — Progress Notes (Signed)
PCCM PROGRESS NOTE  Subjective: On morphine gtt. Discussed w family- many questions. Wanted to know if bathing would make him worse, potential to wake up if valium were stopped.. Vital signs: BP 99/50 mmHg  Pulse 88  Temp(S [id number]$  (1.626 m)  Wt 59.8 kg (131 lb 13.4 oz)  BMI 22.62 kg/m2  SpO2 85%   Objective: General: ill appearing Neuro: unresponsive to light touch, breathing w/o other spontaneous movement Cardiac: regular Chest: quiet, unlabored, regular  Abd: soft, non tender Ext: 1+ edema Skin: no rashes  CMP Latest Ref Rng 11/26/2015 11/25/2015 11/24/2015  Glucose 65 - 99 mg/dL 536(U) 440(H) 474(Q)  BUN 6 - 20 mg/dL 59(D) 63(O) 75(I)  Creatinine 0.61 - 1.24 mg/dL 4.33 2.95 1.88  Sodium 135 - 145 mmol/L 147(H) 144 144  Potassium 3.5 - 5.1 mmol/L 3.7 3.4(L) 3.5  Chloride 101 - 111 mmol/L 114(H) 109 111  CO2 22 - 32 mmol/L Calcium 8.9 - 10.3 mg/dL 8.2(L) 8.3(L) 8.2(L)    CBC Latest Ref Rng 11/26/2015 11/25/2015 11/24/2015  WBC 4.0 - 10.5 K/uL 8.7 9.5 -  Hemoglobin 13.0 - 17.0 g/dL 7.7(L) 8.1(L) 8.4(L)  Hematocrit 39.0 - 52.0 % 23.4(L) 24.8(L) 25.3(L)  Platelets 150 - 400 K/uL 209 182 -    Discussion: 82 yo male with syncopal event at home associated with AKI, hypothermia, lactic acidosis.  He has hx of advanced dementia.  He was found to have status epilepticus.  He has not had improvement with AED's.  Extubated 2/08 with plan to transition to comfort care. Dr Lamar Blinks Palliative Care note reviewed and appreciated. Family in room had many questions about prognosis and treatment options, answered to best of my ability. They still hope that he can wake up and be as before.  Assessment: Acute on chronic encephalopathy with atrophy, seizures, dementia. Acute respiratory failure >> extubated 2/08. Dysphagia. AKI. Hypotension 2nd to hypovolemia. Hypokalemia. Lower GI bleed. Acute blood loss anemia. Fever. Hx of  hypothyroidism.  Plan: DNR/DNI Continue morphine gtt Legitimate question if seizure control requires valium now, or if more for patient comfort.   CD Maple Hudson, MD [facility] Pulmonary/Critical Care December 21, 2015, 8:56 AM Pager:  [phone] After 3pm call: [phone]

## 2015-12-17 DEATH — deceased

## 2017-02-28 IMAGING — MR MR HEAD W/O CM
8 of 10 series · 38 of 48 positions shown · non-contrast
Comparison: CT head November 18, 2015 and MRI head August 20, 2013

CLINICAL DATA: Altered mental status beginning yesterday evening,
witnessed seizure en route to hospital. Hypothermia, subsequent
low-grade fever. History of hypertension, dementia.

EXAM:
MRI HEAD WITHOUT CONTRAST
TECHNIQUE: Multiplanar, multiecho pulse sequences of the brain and surrounding
structures were obtained without intravenous contrast.

[Series 3: DWI · axial · 3.0mm · 1.09mm/px · z∈[-42,+99]mm · 9 of 100 slices shown (1 of 4)]
[im 1/100]
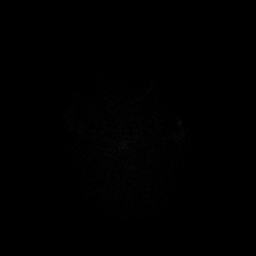
[im 19/100]
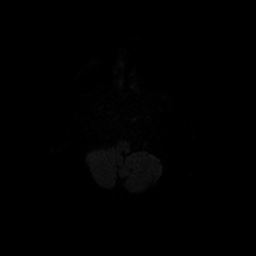
[im 28/100]
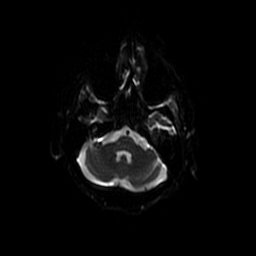
[im 46/100]
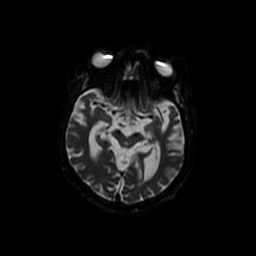
[im 55/100]
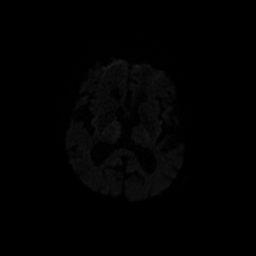
[im 73/100]
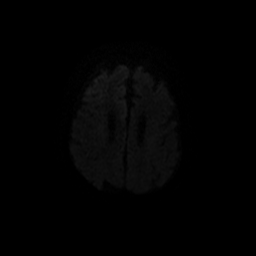
[im 82/100]
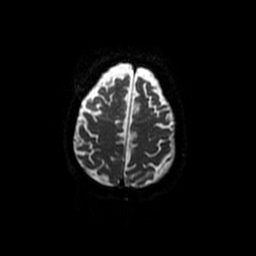
[im 91/100]
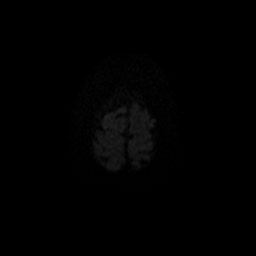
[im 100/100]
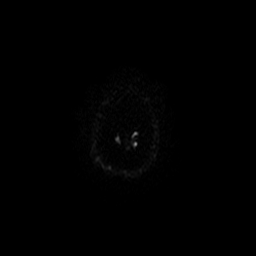

[Series 5: T2 · axial · 5.0mm · 0.43mm/px · z∈[-49,+89]mm · 3 of 25 slices shown (1 of 2)]
[im 1/25]
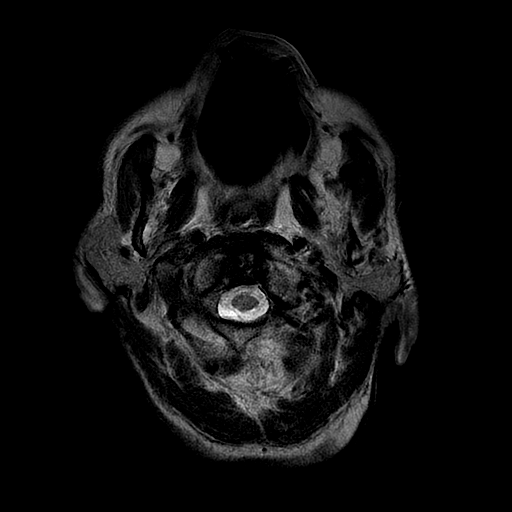
[im 13/25]
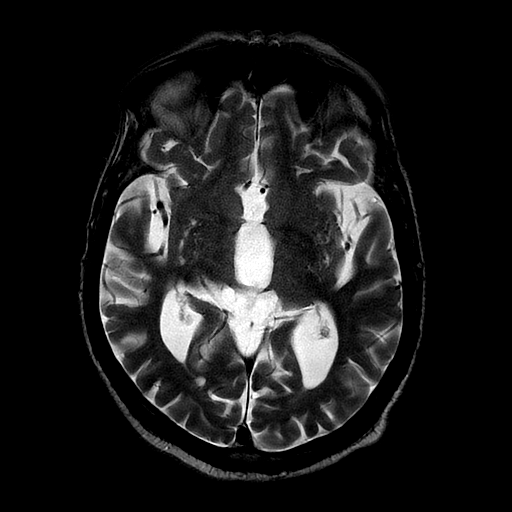
[im 25/25]
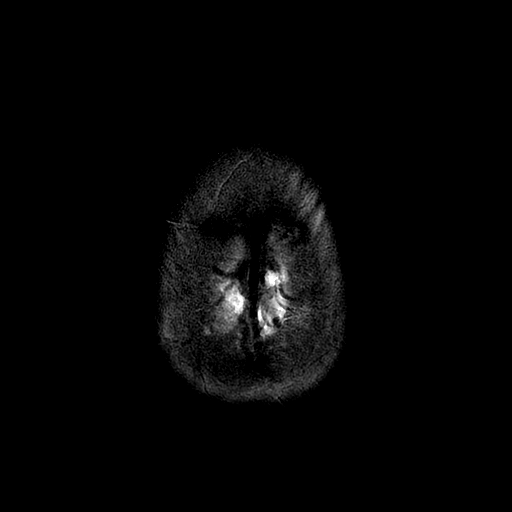

[Series 6: FLAIR · axial · 5.0mm · 0.43mm/px · z∈[-49,+89]mm · 3 of 25 slices shown (1 of 2)]
[im 1/25]
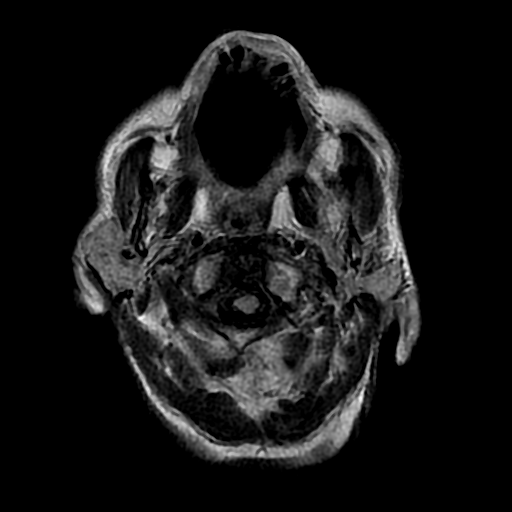
[im 13/25]
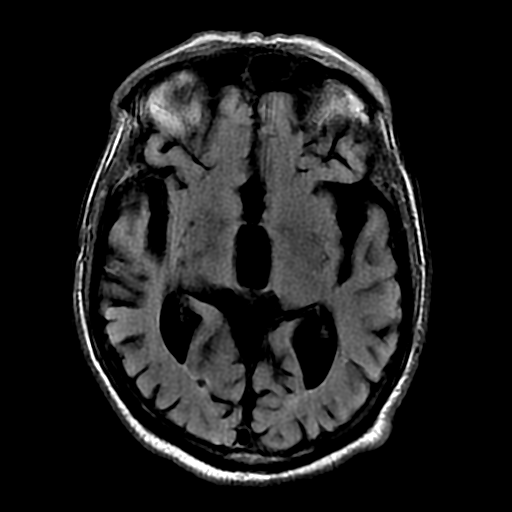
[im 25/25]
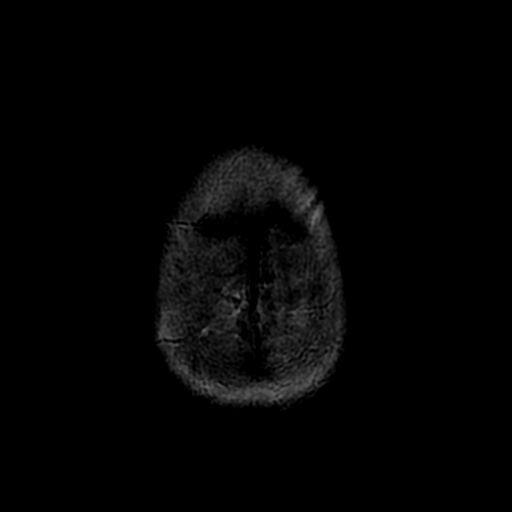

[Series 7: DWI · coronal · 5.0mm · 1.09mm/px · 8 of 66 slices shown (2 of 4)]
[im 1/66]
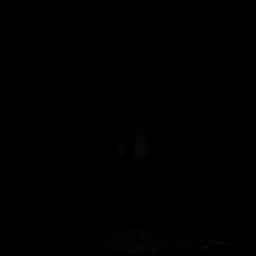
[im 10/66]
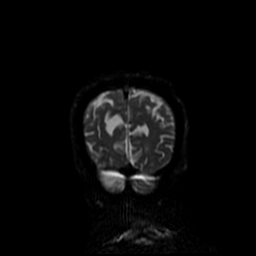
[im 19/66]
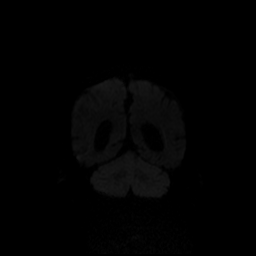
[im 28/66]
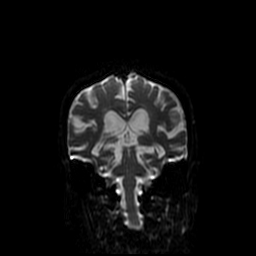
[im 38/66]
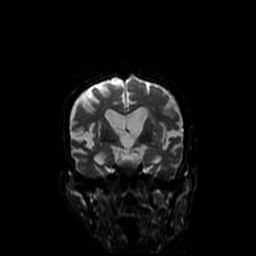
[im 47/66]
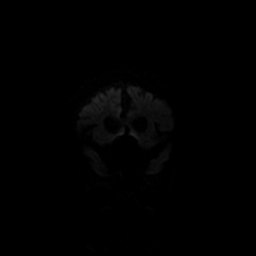
[im 56/66]
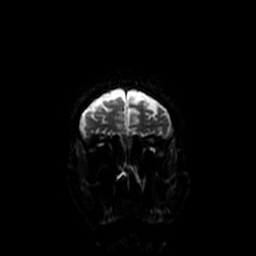
[im 66/66]
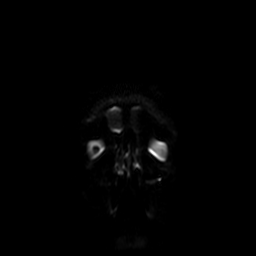

[Series 9: FLAIR · axial · 5.0mm · 0.43mm/px · z∈[-49,+89]mm · 3 of 25 slices shown (2 of 2)]
[im 1/25]
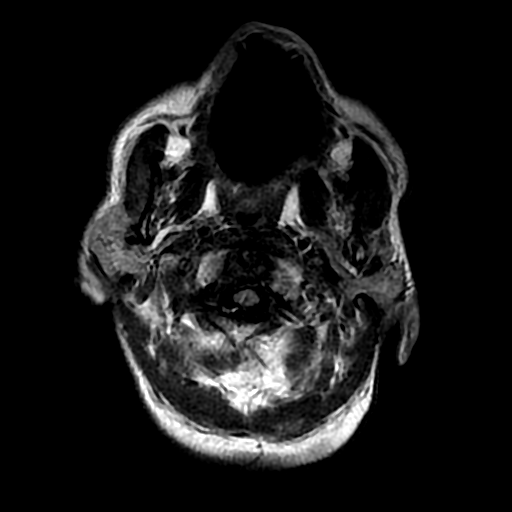
[im 13/25]
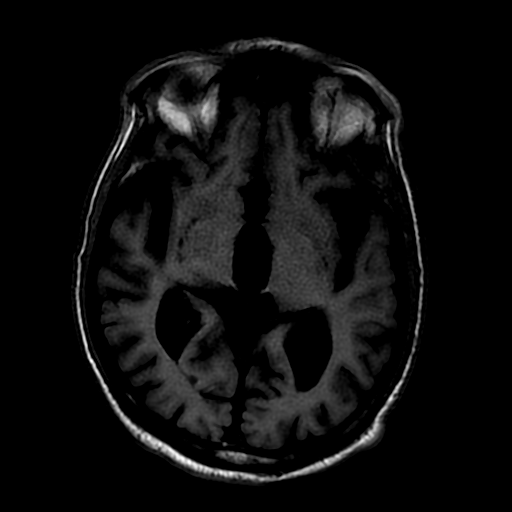
[im 25/25]
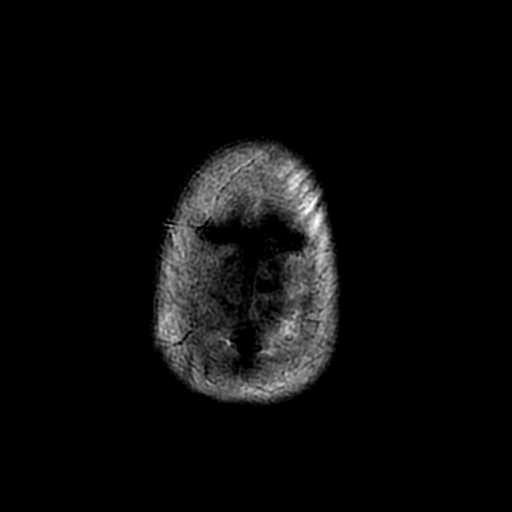

[Series 10: T2 · coronal · 5.0mm · 0.43mm/px · 2 of 28 slices shown (2 of 2)]
[im 1/28]
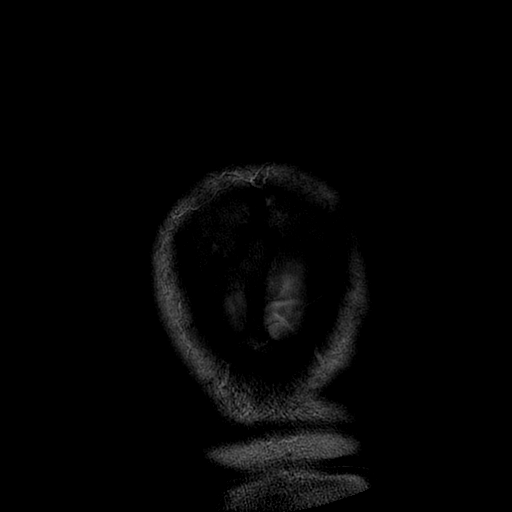
[im 14/28]
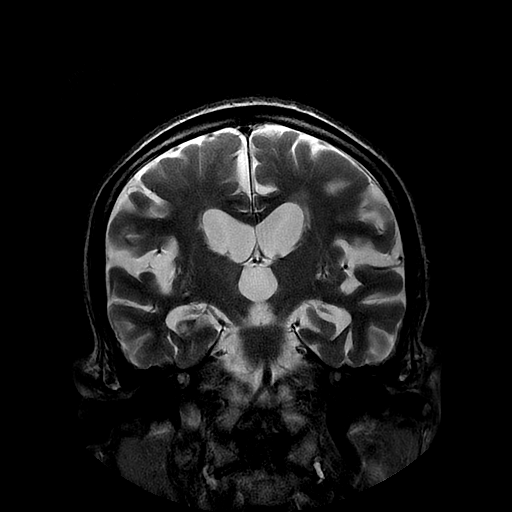

[Series 300: DWI · axial · 3.0mm · 1.09mm/px · z∈[-42,+99]mm · 6 of 50 slices shown (3 of 4)]
[im 1/50]
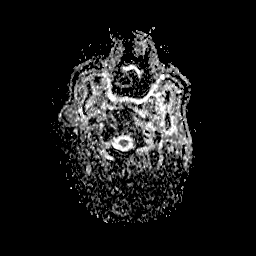
[im 10/50]
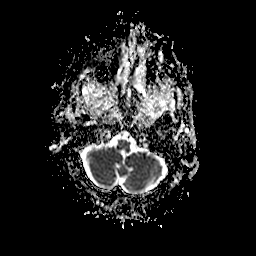
[im 20/50]
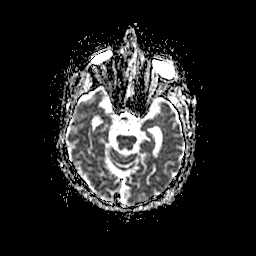
[im 30/50]
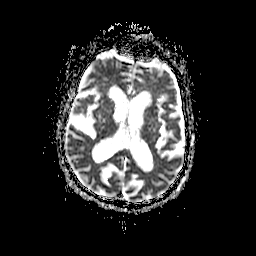
[im 40/50]
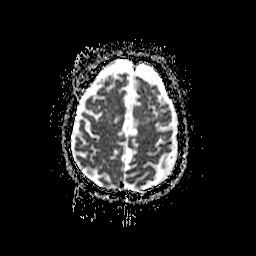
[im 50/50]
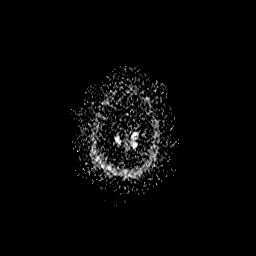

[Series 700: DWI · coronal · 5.0mm · 1.09mm/px · 4 of 33 slices shown (4 of 4)]
[im 1/33]
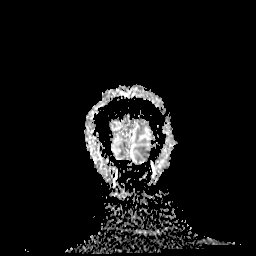
[im 11/33]
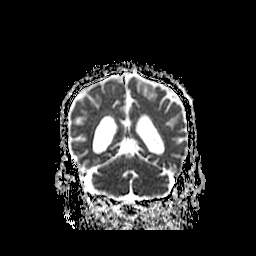
[im 22/33]
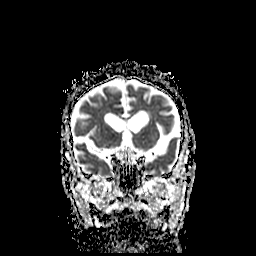
[im 33/33]
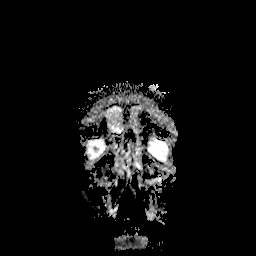

[38 of 48 positions shown; findings below may reference images not displayed]

FINDINGS: Multiple sequences are motion degraded.

No reduced diffusion to suggest acute ischemia. No susceptibility
artifact to suggest hemorrhage. Severe ventriculomegaly on the basis
of global parenchymal brain volume loss. Disproportionate severe
midbrain volume loss. RIGHT basal ganglia lacunar infarct. No
midline shift, mass lesions or mass effect. Scattered subcentimeter
supratentorial white matter T2 hyperintensities compatible with mild
chronic small vessel ischemic disease.

Bilateral holo hemispheric 2-3 mm chronic subdural hematomas,
unchanged from prior MRI. Dolicoectatic intracranial vessels at the
skull base. Mild paranasal sinus mucosal thickening with LEFT
maxillary mucosal retention cyst. Mastoid air cells are well
aerated. Craniocervical junction intact. No suspicious calvarial
bone marrow signal.
IMPRESSION: No acute intracranial process.

Stable appearance of the head including severe global brain atrophy,
with severe midbrain volume loss which can be associated with
supranuclear palsy. Small bilateral chronic subdural hematomas.
